# Patient Record
Sex: Male | Born: 1950 | Race: White | Hispanic: No | Marital: Married | Smoking: Former smoker
Health system: Southern US, Community
[De-identification: ages and names within clinical notes are randomized; demographics above are authoritative.]

## PROBLEM LIST (undated history)

## (undated) DIAGNOSIS — Z972 Presence of dental prosthetic device (complete) (partial): Secondary | ICD-10-CM

## (undated) DIAGNOSIS — K219 Gastro-esophageal reflux disease without esophagitis: Secondary | ICD-10-CM

## (undated) DIAGNOSIS — I10 Essential (primary) hypertension: Secondary | ICD-10-CM

## (undated) DIAGNOSIS — R7303 Prediabetes: Secondary | ICD-10-CM

## (undated) DIAGNOSIS — Z86010 Personal history of colonic polyps: Secondary | ICD-10-CM

## (undated) DIAGNOSIS — Z860101 Personal history of adenomatous and serrated colon polyps: Secondary | ICD-10-CM

## (undated) DIAGNOSIS — M1990 Unspecified osteoarthritis, unspecified site: Secondary | ICD-10-CM

## (undated) HISTORY — DX: Gastro-esophageal reflux disease without esophagitis: K21.9

## (undated) HISTORY — DX: Essential (primary) hypertension: I10

## (undated) HISTORY — DX: Personal history of colonic polyps: Z86.010

## (undated) HISTORY — DX: Prediabetes: R73.03

## (undated) HISTORY — PX: OTHER SURGICAL HISTORY: SHX169

## (undated) HISTORY — PX: COLONOSCOPY: SHX174

---

## 1968-04-27 HISTORY — PX: FINGER SURGERY: SHX640

## 2010-02-19 ENCOUNTER — Encounter (INDEPENDENT_AMBULATORY_CARE_PROVIDER_SITE_OTHER): Payer: Self-pay | Admitting: *Deleted

## 2010-03-26 ENCOUNTER — Encounter (INDEPENDENT_AMBULATORY_CARE_PROVIDER_SITE_OTHER): Payer: Self-pay | Admitting: *Deleted

## 2010-03-27 ENCOUNTER — Ambulatory Visit: Payer: Self-pay | Admitting: Internal Medicine

## 2010-04-03 ENCOUNTER — Ambulatory Visit: Payer: Self-pay | Admitting: Internal Medicine

## 2010-04-03 DIAGNOSIS — Z860100 Personal history of colon polyps, unspecified: Secondary | ICD-10-CM | POA: Insufficient documentation

## 2010-04-03 HISTORY — DX: Personal history of colonic polyps: Z86.010

## 2010-04-07 ENCOUNTER — Encounter: Payer: Self-pay | Admitting: Internal Medicine

## 2010-05-27 NOTE — Miscellaneous (Signed)
Summary: [facility]  Clinical Lists Changes  Medications: Added new medication of MOVIPREP 100 GM  SOLR (PEG-KCL-NACL-NASULF-NA ASC-C) As per prep instructions. - Signed Rx of MOVIPREP 100 GM  SOLR (PEG-KCL-NACL-NASULF-NA ASC-C) As per prep instructions.;  #1 x 0;  Signed;  Entered by: Ezra Sites RN;  Authorized by: Iva Boop MD, [facility];  Method used: Electronically to [phone]*, [address] [place], [place], [place]  [address], Ph: [phone] or [phone], Fax: [phone] Observations: Added new observation of NKA: T (03/27/2010 8:35)    Prescriptions: MOVIPREP 100 GM  SOLR (PEG-KCL-NACL-NASULF-NA ASC-C) As per prep instructions.  #1 x 0   Entered by:   Ezra Sites RN   Authorized by:   Iva Boop MD, [facility]   Signed by:   Ezra Sites RN on 03/27/2010   Method used:   Electronically to        [facility] [phone]* (retail)       [address] [place], [place]  [address]       Ph: [phone] or [phone]       Fax: [phone]   RxID:   [id number]

## 2010-05-27 NOTE — Letter (Signed)
Summary: [facility] Instructions  [facility]  [address] [place], [place] [address]   Phone: [phone]  Fax: [phone]       ISAAC DUBIE    1950/08/01    MRN: [id number]        Procedure Day /Date:  Thursday 04/03/2010     Arrival Time:  9:00 am     Procedure Time:  10:00 am     Location of Procedure:                    _ x_  [facility] (4th Floor)                        PREPARATION FOR COLONOSCOPY WITH MOVIPREP   Starting 5 days prior to your procedure Saturday 12/3 do not eat nuts, seeds, popcorn, corn, beans, peas,  salads, or any raw vegetables.  Do not take any fiber supplements (e.g. Metamucil, Citrucel, and Benefiber).  THE DAY BEFORE YOUR PROCEDURE         DATE: Wednesday 12/7  1.  Drink clear liquids the entire day-NO SOLID FOOD  2.  Do not drink anything colored red or purple.  Avoid juices with pulp.  No orange juice.  3.  Drink at least 64 oz. (8 glasses) of fluid/clear liquids during the day to prevent dehydration and help the prep work efficiently.  CLEAR LIQUIDS INCLUDE: Water Jello Ice Popsicles Tea (sugar ok, no milk/cream) Powdered fruit flavored drinks Coffee (sugar ok, no milk/cream) Gatorade Juice: apple, white grape, white cranberry  Lemonade Clear bullion, consomm, broth Carbonated beverages (any kind) Strained chicken noodle soup Hard Candy                             4.  In the morning, mix first dose of MoviPrep solution:    Empty 1 Pouch A and 1 Pouch B into the disposable container    Add lukewarm drinking water to the top line of the container. Mix to dissolve    Refrigerate (mixed solution should be used within 24 hrs)  5.  Begin drinking the prep at 5:00 p.m. The MoviPrep container is divided by 4 marks.   Every 15 minutes drink the solution down to the next mark (approximately 8 oz) until the full liter is complete.   6.  Follow completed prep with 16 oz of clear liquid of your choice  (Nothing red or purple).  Continue to drink clear liquids until bedtime.  7.  Before going to bed, mix second dose of MoviPrep solution:    Empty 1 Pouch A and 1 Pouch B into the disposable container    Add lukewarm drinking water to the top line of the container. Mix to dissolve    Refrigerate  THE DAY OF YOUR PROCEDURE      DATE: Thursday 12/8  Beginning at 5:00 a.m. (5 hours before procedure):         1. Every 15 minutes, drink the solution down to the next mark (approx 8 oz) until the full liter is complete.  2. Follow completed prep with 16 oz. of clear liquid of your choice.    3. You may drink clear liquids until 8:00 am (2 HOURS BEFORE PROCEDURE).   MEDICATION INSTRUCTIONS  Unless otherwise instructed, you should take regular prescription medications with a small sip of water   as early as possible the morning  of your procedure.           OTHER INSTRUCTIONS  You will need a responsible adult at least 62 years of age to accompany you and drive you home.   This person must remain in the waiting room during your procedure.  Wear loose fitting clothing that is easily removed.  Leave jewelry and other valuables at home.  However, you may wish to bring a book to read or  an iPod/MP3 player to listen to music as you wait for your procedure to start.  Remove all body piercing jewelry and leave at home.  Total time from sign-in until discharge is approximately 2-3 hours.  You should go home directly after your procedure and rest.  You can resume normal activities the  day after your procedure.  The day of your procedure you should not:   Drive   Make legal decisions   Operate machinery   Drink alcohol   Return to work  You will receive specific instructions about eating, activities and medications before you leave.    The above instructions have been reviewed and explained to me by   Ezra Sites RN  March 27, 2010 9:16 AM   I fully understand and  can verbalize these instructions _____________________________ Date _________

## 2010-05-27 NOTE — Letter (Signed)
Summary: Pre Visit Letter Revised  [facility]  [address] [place], [place] [address]   Phone: [phone]  Fax: [phone]        02/19/2010 MRN: [id number] Ronald Pitts [address] [place], [place]  [address]             Procedure Date:  04/03/2010   Welcome to the Gastroenterology Division at [facility].    You are scheduled to see a nurse for your pre-procedure visit on 03/27/2010 at 9:00AM on the 3rd floor at [facility], [address]  We ask that you try to arrive at our office 15 minutes prior to your appointment time to allow for check-in.  Please take a minute to review the attached form.  If you answer "Yes" to one or more of the questions on the first page, we ask that you call the person listed at your earliest opportunity.  If you answer "No" to all of the questions, please complete the rest of the form and bring it to your appointment.    Your nurse visit will consist of discussing your medical and surgical history, your immediate family medical history, and your medications.   If you are unable to list all of your medications on the form, please bring the medication bottles to your appointment and we will list them.  We will need to be aware of both prescribed and over the counter drugs.  We will need to know exact dosage information as well.    Please be prepared to read and sign documents such as consent forms, a financial agreement, and acknowledgement forms.  If necessary, and with your consent, a friend or relative is welcome to sit-in on the nurse visit with you.  Please bring your insurance card so that we may make a copy of it.  If your insurance requires a referral to see a specialist, please bring your referral form from your primary care physician.  No co-pay is required for this nurse visit.     If you cannot keep your appointment, please call [phone] to cancel or reschedule prior to your appointment date.  This allows [place] the  opportunity to schedule an appointment for another patient in need of care.    Thank you for choosing [facility] for your medical needs.  We appreciate the opportunity to care for you.  Please visit [place] at our website  to learn more about our practice.  Sincerely, The Gastroenterology Division

## 2010-05-29 NOTE — Letter (Signed)
Summary: Patient Notice- Polyp Results  [facility]  [address] [place], [place] [address]   Phone: [phone]  Fax: [phone]        April 07, 2010 MRN: [id number]    [facility] [address] [place], [place]  [address]    Dear Mr. Ronald Pitts,  Three of the four polyps removed from your colon were adenomatous. This means that they were pre-cancerous or that  they had the potential to change into cancer over time. The other polyp was hyperplastic and is not usually pre-cancerous.  I recommend that you have a repeat colonoscopy in 3 years to determine if you have developed any new polyps over time and screen for colorectal cancer. If you develop any new rectal bleeding, abdominal pain or significant bowel habit changes, please contact us before then.  In addition to repeating colonoscopy, changing health habits may reduce your risk of having more colon or rectal  polyps and possibly, colorectal cancer. You may lower your risk of future polyps and colorectal cancer by adopting healthy habits such as not smoking or using tobacco (if you do), being physically active, losing weight (if overweight), and eating a diet which includes fruits and vegetables and limits red meat.  Please call us if you are having persistent problems or have questions about your condition that have not been fully answered at this time.  Sincerely,  Iva Boop MD, [facility]  This letter has been electronically signed by your physician.  Appended Document: Patient Notice- Polyp Results Letter mailed

## 2010-05-29 NOTE — Procedures (Signed)
Summary: Colonoscopy  Patient: Ronald Pitts Note: All result statuses are Final unless otherwise noted.  Tests: (1) Colonoscopy (COL)   COL Colonoscopy           DONE     [facility]     [address]     [place], [place]  [address]           COLONOSCOPY PROCEDURE REPORT           PATIENT:  Ronald Pitts, Ronald Pitts  MR#:  [id number]     BIRTHDATE:  [birth year], 59 yrs. old  GENDER:  male     ENDOSCOPIST:  Iva Boop, MD, [facility]     REF. BY:  Joette Catching, M.D.     PROCEDURE DATE:  04/03/2010     PROCEDURE:  Colonoscopy with snare polypectomy     ASA CLASS:  Class II     INDICATIONS:  Routine Risk Screening     MEDICATIONS:   Fentanyl 50 mcg IV, Versed 5 mg IV           DESCRIPTION OF PROCEDURE:   After the risks benefits and     alternatives of the procedure were thoroughly explained, informed     consent was obtained.  Digital rectal exam was performed and     revealed no abnormalities and normal prostate.   The LB 180AL     [id number] endoscope was introduced through the anus and advanced to     the cecum, which was identified by both the appendix and ileocecal     valve, without limitations.  The quality of the prep was     excellent, using MoviPrep.  The instrument was then slowly     withdrawn as the colon was fully examined.     Insertion: 3:24 minutes Withdrawal; 18:47 minutes     <<PROCEDUREIMAGES>>           FINDINGS:  Four polyps were found. Cecum (1), transverse (2) and     rectum (1). All diminutive (5mm or less). Polyps were snared     without cautery. Retrieval was successful. Severe diverticulosis     was found in the left colon.  This was otherwise a normal     examination of the colon.   Retroflexed views in the rectum     revealed no abnormalities.    The scope was then withdrawn from     the patient and the procedure completed.           COMPLICATIONS:  None     ENDOSCOPIC IMPRESSION:     1) Four diminutive polyps removed.     2) Severe diverticulosis  in the left colon     3) Otherwise normal examination, excellent prep.           REPEAT EXAM:  In for Colonoscopy, pending biopsy results.           Iva Boop, MD, [facility]           CC:  Joette Catching, MD     The Patient           n.     eSIGNED:   Iva Boop at 04/03/2010 10:56 AM           Burt Knack, [id number]  Note: An exclamation mark (!) indicates a result that was not dispersed into the flowsheet. Document Creation Date: 04/03/2010 10:57 AM _______________________________________________________________________  (1) Order result status: Final Collection or observation date-time: 04/03/2010 10:48 Requested  date-time:  Receipt date-time:  Reported date-time:  Referring Physician:   Ordering Physician: Stan Head [id number]) Specimen Source:  Source: Launa Grill Order Number: [id number] Lab site:   Appended Document: Colonoscopy   Colonoscopy  Procedure date:  04/03/2010  Findings:          1) Four diminutive polyps removed.     2) Severe diverticulosis in the left colon     3) Otherwise normal examination, excellent prep.      - ADENOMATOUS POLYPS (FOUR FRAGMENTS) AND HYPERPLASTIC POLYP. NO HIGH GRADE DYSPLASIA OR INVASIVE MALIGNANCY IDENTIFIED.  Comments:      Repeat colonoscopy in 3 years.     Procedures Next Due Date:    Colonoscopy: 03/2013   Appended Document: Colonoscopy     Procedures Next Due Date:    Colonoscopy: 03/2013

## 2013-05-08 ENCOUNTER — Encounter: Payer: Self-pay | Admitting: Internal Medicine

## 2013-09-23 ENCOUNTER — Encounter: Payer: Self-pay | Admitting: Internal Medicine

## 2013-11-29 DIAGNOSIS — E782 Mixed hyperlipidemia: Secondary | ICD-10-CM | POA: Insufficient documentation

## 2013-11-29 DIAGNOSIS — I10 Essential (primary) hypertension: Secondary | ICD-10-CM | POA: Insufficient documentation

## 2013-11-29 DIAGNOSIS — E785 Hyperlipidemia, unspecified: Secondary | ICD-10-CM | POA: Insufficient documentation

## 2013-11-29 DIAGNOSIS — E1159 Type 2 diabetes mellitus with other circulatory complications: Secondary | ICD-10-CM | POA: Insufficient documentation

## 2015-03-11 ENCOUNTER — Encounter: Payer: Self-pay | Admitting: Internal Medicine

## 2015-04-24 ENCOUNTER — Ambulatory Visit (AMBULATORY_SURGERY_CENTER): Payer: 59

## 2015-04-24 VITALS — Ht 70.0 in | Wt 272.6 lb

## 2015-04-24 DIAGNOSIS — Z860100 Personal history of colon polyps, unspecified: Secondary | ICD-10-CM

## 2015-04-24 NOTE — Progress Notes (Signed)
No allergies to eggs or soy No home oxygen No past problems with anesthesia No diet/weight loss meds  Has email and internet; registered for emmi for colonoscopy

## 2015-05-08 ENCOUNTER — Encounter: Payer: Self-pay | Admitting: Internal Medicine

## 2015-05-08 ENCOUNTER — Ambulatory Visit (AMBULATORY_SURGERY_CENTER): Payer: Medicare Other | Admitting: Internal Medicine

## 2015-05-08 VITALS — BP 121/80 | HR 76 | Temp 98.1°F | Resp 42 | Ht 70.0 in | Wt 272.0 lb

## 2015-05-08 DIAGNOSIS — Z86010 Personal history of colonic polyps: Secondary | ICD-10-CM

## 2015-05-08 DIAGNOSIS — D123 Benign neoplasm of transverse colon: Secondary | ICD-10-CM | POA: Diagnosis not present

## 2015-05-08 DIAGNOSIS — D120 Benign neoplasm of cecum: Secondary | ICD-10-CM | POA: Diagnosis not present

## 2015-05-08 HISTORY — PX: COLONOSCOPY W/ POLYPECTOMY: SHX1380

## 2015-05-08 MED ORDER — SODIUM CHLORIDE 0.9 % IV SOLN: 500.0000 mL | INTRAVENOUS | Status: DC

## 2015-05-08 NOTE — Progress Notes (Signed)
Called to room to assist during endoscopic procedure.  Patient ID and intended procedure confirmed with present staff. Received instructions for my participation in the procedure from the performing physician.  

## 2015-05-08 NOTE — Op Note (Signed)
[facility]  [address] [place], [address]   COLONOSCOPY PROCEDURE REPORT  PATIENT: Ronald Pitts, Ronald Pitts  MR#: [id number] BIRTHDATE: [birth year] , 37  yrs. old GENDER: male ENDOSCOPIST: Gatha Mayer, MD, [facility] PROCEDURE DATE:  05/08/2015 PROCEDURE:   Colonoscopy with snare polypectomy First Screening Colonoscopy - Avg.  risk and is 50 yrs.  old or older - No.  Prior Negative Screening - Now for repeat screening. N/A  History of Adenoma - Now for follow-up colonoscopy & has been > or = to 3 yrs.  Yes hx of adenoma.  Has been 3 or more years since last colonoscopy.  Polyps removed today? Yes ASA CLASS:   Class II INDICATIONS:Surveillance due to prior colonic neoplasia and PH Colon Adenoma. MEDICATIONS: Propofol 240 mg IV and Monitored anesthesia care  DESCRIPTION OF PROCEDURE:   After the risks benefits and alternatives of the procedure were thoroughly explained, informed consent was obtained.  The digital rectal exam revealed no abnormalities of the rectum, revealed the prostate was not enlarged, and revealed no prostatic nodules.   The LB [id number]  endoscope was introduced through the anus and advanced to the cecum, which was identified by both the appendix and ileocecal valve. No adverse events experienced.   The quality of the prep was good.  (MiraLax was used)  The instrument was then slowly withdrawn as the colon was fully examined. Estimated blood loss is zero unless otherwise noted in this procedure report.   COLON FINDINGS: Two sessile polyps measuring 5 mm in size were found in the transverse colon and at the cecum.  Polypectomies were performed with a cold snare.  The resection was complete, the polyp tissue was completely retrieved and sent to histology.   There was diverticulosis noted in the sigmoid colon.   The examination was otherwise normal.  Retroflexed views revealed no abnormalities. The time to cecum = 2.0 Withdrawal time = 15.7   The  scope was withdrawn and the procedure completed. COMPLICATIONS: There were no immediate complications.  ENDOSCOPIC IMPRESSION: 1.   Two sessile polyps were found in the transverse colon and at the cecum; polypectomies were performed with a cold snare 2.   Diverticulosis was noted in the sigmoid colon 3.   The examination was otherwise normal  RECOMMENDATIONS: Timing of repeat colonoscopy will be determined by pathology findings.  Likely 5 yrs - he also has 3 diminutive adenomas 2011  eSigned:  Gatha Mayer, MD, [facility] 05/08/2015 11:38 AM   cc: Dr. Dione Housekeeper and The Patient

## 2015-05-08 NOTE — Progress Notes (Signed)
Report to PACU, RN, vss, BBS= Clear.  

## 2015-05-08 NOTE — Patient Instructions (Addendum)
I found and removed 2 small polyps that look benign.  You also have a condition called diverticulosis - common and not usually a problem. Please read the handout provided.  I will let you know pathology results and when to have another routine colonoscopy by mail.  I appreciate the opportunity to care for you. Gatha Mayer, MD, FACG   YOU HAD AN ENDOSCOPIC PROCEDURE TODAY AT [facility]:   Refer to the procedure report that was given to you for any specific questions about what was found during the examination.  If the procedure report does not answer your questions, please call your gastroenterologist to clarify.  If you requested that your care partner not be given the details of your procedure findings, then the procedure report has been included in a sealed envelope for you to review at your convenience later.  YOU SHOULD EXPECT: Some feelings of bloating in the abdomen. Passage of more gas than usual.  Walking can help get rid of the air that was put into your GI tract during the procedure and reduce the bloating. If you had a lower endoscopy (such as a colonoscopy or flexible sigmoidoscopy) you may notice spotting of blood in your stool or on the toilet paper. If you underwent a bowel prep for your procedure, you may not have a normal bowel movement for a few days.  Please Note:  You might notice some irritation and congestion in your nose or some drainage.  This is from the oxygen used during your procedure.  There is no need for concern and it should clear up in a day or so.  SYMPTOMS TO REPORT IMMEDIATELY:   Following lower endoscopy (colonoscopy or flexible sigmoidoscopy):  Excessive amounts of blood in the stool  Significant tenderness or worsening of abdominal pains  Swelling of the abdomen that is new, acute  Fever of 100F or higher   For urgent or emergent issues, a gastroenterologist can be reached at any hour by calling [phone]  [phone].   DIET: Your first meal following the procedure should be a small meal and then it is ok to progress to your normal diet. Heavy or fried foods are harder to digest and may make you feel nauseous or bloated.  Likewise, meals heavy in dairy and vegetables can increase bloating.  Drink plenty of fluids but you should avoid alcoholic beverages for 24 hours.  ACTIVITY:  You should plan to take it easy for the rest of today and you should NOT DRIVE or use heavy machinery until tomorrow (because of the sedation medicines used during the test).    FOLLOW UP: Our staff will call the number listed on your records the next business day following your procedure to check on you and address any questions or concerns that you may have regarding the information given to you following your procedure. If we do not reach you, we will leave a message.  However, if you are feeling well and you are not experiencing any problems, there is no need to return our call.  We will assume that you have returned to your regular daily activities without incident.  If any biopsies were taken you will be contacted by phone or by letter within the next 1-3 weeks.  Please call us at [phone] if you have not heard about the biopsies in 3 weeks.    SIGNATURES/CONFIDENTIALITY: You and/or your care partner have signed paperwork which will be entered into your electronic medical record.  These signatures attest to the fact that that the information above on your After Visit Summary has been reviewed and is understood.  Full responsibility of the confidentiality of this discharge information lies with you and/or your care-partner.   Information on polyps and diverticulosis given to you today

## 2015-05-09 ENCOUNTER — Telehealth: Payer: Self-pay

## 2015-05-09 NOTE — Telephone Encounter (Signed)
  Follow up Call-  Call back number 05/08/2015  Post procedure Call Back phone  # [phone]  Permission to leave phone message Yes     Patient questions:  Do you have a fever, pain , or abdominal swelling? No. Pain Score  0 *  Have you tolerated food without any problems? Yes.    Have you been able to return to your normal activities? Yes.    Do you have any questions about your discharge instructions: Diet   No. Medications  No. Follow up visit  No.  Do you have questions or concerns about your Care? No.  Actions: * If pain score is 4 or above: No action needed, pain <4.

## 2015-05-13 ENCOUNTER — Encounter: Payer: Self-pay | Admitting: Internal Medicine

## 2015-05-13 DIAGNOSIS — Z86010 Personal history of colonic polyps: Secondary | ICD-10-CM

## 2015-05-13 NOTE — Progress Notes (Signed)
Quick Note:  2 adenomas - recall colon 2022 ______

## 2015-11-02 ENCOUNTER — Emergency Department (HOSPITAL_COMMUNITY): Payer: Medicare Other

## 2015-11-02 ENCOUNTER — Encounter (HOSPITAL_COMMUNITY): Payer: Self-pay | Admitting: *Deleted

## 2015-11-02 ENCOUNTER — Emergency Department (HOSPITAL_COMMUNITY)
Admission: EM | Admit: 2015-11-02 | Discharge: 2015-11-02 | Disposition: A | Discharge status: Home / Self Care | Payer: Medicare Other | Attending: Emergency Medicine | Admitting: Emergency Medicine

## 2015-11-02 DIAGNOSIS — Z87891 Personal history of nicotine dependence: Secondary | ICD-10-CM | POA: Diagnosis not present

## 2015-11-02 DIAGNOSIS — I10 Essential (primary) hypertension: Secondary | ICD-10-CM | POA: Diagnosis not present

## 2015-11-02 DIAGNOSIS — Z7982 Long term (current) use of aspirin: Secondary | ICD-10-CM | POA: Insufficient documentation

## 2015-11-02 DIAGNOSIS — M25562 Pain in left knee: Secondary | ICD-10-CM | POA: Insufficient documentation

## 2015-11-02 MED ORDER — IBUPROFEN 800 MG PO TABS: 800.0000 mg | ORAL_TABLET | Freq: Three times a day (TID) | ORAL | Status: DC

## 2015-11-02 MED ORDER — HYDROCODONE-ACETAMINOPHEN 5-325 MG PO TABS: ORAL_TABLET | ORAL | Status: DC

## 2015-11-02 MED ORDER — HYDROCODONE-ACETAMINOPHEN 5-325 MG PO TABS
1.0000 | ORAL_TABLET | Freq: Once | ORAL | Status: AC
  Administered 2015-11-02: 1 via ORAL
  Filled 2015-11-02: qty 1

## 2015-11-02 NOTE — Discharge Instructions (Signed)
Cryotherapy Cryotherapy is when you put ice on your injury. Ice helps lessen pain and puffiness (swelling) after an injury. Ice works the best when you start using it in the first 24 to 48 hours after an injury. HOME CARE  Put a dry or damp towel between the ice pack and your skin.  You may press gently on the ice pack.  Leave the ice on for no more than 10 to 20 minutes at a time.  Check your skin after 5 minutes to make sure your skin is okay.  Rest at least 20 minutes between ice pack uses.  Stop using ice when your skin loses feeling (numbness).  Do not use ice on someone who cannot tell you when it hurts. This includes small children and people with memory problems (dementia). GET HELP RIGHT AWAY IF:  You have white spots on your skin.  Your skin turns blue or pale.  Your skin feels waxy or hard.  Your puffiness gets worse. MAKE SURE YOU:   Understand these instructions.  Will watch your condition.  Will get help right away if you are not doing well or get worse.   This information is not intended to replace advice given to you by your health care provider. Make sure you discuss any questions you have with your health care provider.   Document Released: 09/30/2007 Document Revised: 07/06/2011 Document Reviewed: 12/04/2010 Elsevier Interactive Patient Education 2016 Elsevier Inc.  Knee Pain Knee pain is a common problem. It can have many causes. The pain often goes away by following your doctor's home care instructions. Treatment for ongoing pain will depend on the cause of your pain. If your knee pain continues, more tests may be needed to diagnose your condition. Tests may include X-rays or other imaging studies of your knee. HOME CARE  Take medicines only as told by your doctor.  Rest your knee and keep it raised (elevated) while you are resting.  Do not do things that cause pain or make your pain worse.  Avoid activities where both feet leave the ground at  the same time, such as running, jumping rope, or doing jumping jacks.  Apply ice to the knee area:  Put ice in a plastic bag.  Place a towel between your skin and the bag.  Leave the ice on for 20 minutes, 2-3 times a day.  Ask your doctor if you should wear an elastic knee support.  Sleep with a pillow under your knee.  Lose weight if you are overweight. Being overweight can make your knee hurt more.  Do not use any tobacco products, including cigarettes, chewing tobacco, or electronic cigarettes. If you need help quitting, ask your doctor. Smoking may slow the healing of any bone and joint problems that you may have. GET HELP IF:  Your knee pain does not stop, it changes, or it gets worse.  You have a fever along with knee pain.  Your knee gives out or locks up.  Your knee becomes more swollen. GET HELP RIGHT AWAY IF:   Your knee feels hot to the touch.  You have chest pain or trouble breathing.   This information is not intended to replace advice given to you by your health care provider. Make sure you discuss any questions you have with your health care provider.   Document Released: 07/10/2008 Document Revised: 05/04/2014 Document Reviewed: 06/14/2013 Elsevier Interactive Patient Education [employer].

## 2015-11-02 NOTE — ED Notes (Signed)
Pt comes in with left knee pain starting 4-5 days ago. Pt denies any injury but states that he was standing for several hours the day that it began hurting.

## 2015-11-05 NOTE — ED Provider Notes (Signed)
CSN: [id number]     Arrival date & time 11/02/15  [id number] History   First MD Initiated Contact with Patient 11/02/15 [phone]     Chief Complaint  Patient presents with  . Knee Pain     (Consider location/radiation/quality/duration/timing/severity/associated sxs/prior Treatment) HPI  Ronald Pitts is a 65 y.o. male who presents to the Emergency Department complaining of left knee pain for 5 days.  He states the pain began after excessive standing.  He complains of pain with bending and states the knee "feels stiff."  He has not taken any medications for symptoms relief.  He denies fall, redness, swelling, numbness or weakness of the extremity.   Past Medical History  Diagnosis Date  . Hypertension   . Personal history of colonic polyps 04/03/2010   Past Surgical History  Procedure Laterality Date  . Index finger      right  . Colonoscopy     Family History  Problem Relation Age of Onset  . Colon cancer Neg Hx    Social History  Substance Use Topics  . Smoking status: Former Research scientist (life sciences)  . Smokeless tobacco: Never Used  . Alcohol Use: 0.0 oz/week    0 Standard drinks or equivalent per week     Comment: OCC    Review of Systems  Constitutional: Negative for fever and chills.  Musculoskeletal: Positive for arthralgias (left knee pain). Negative for joint swelling.  Skin: Negative for color change and wound.  Neurological: Negative for numbness.  All other systems reviewed and are negative.     Allergies  Review of patient's allergies indicates no known allergies.  Home Medications   Prior to Admission medications   Medication Sig Start Date End Date Taking? Authorizing Provider  amLODipine (NORVASC) 5 MG tablet Take 5 mg by mouth daily.    Historical Provider, MD  aspirin 81 MG tablet Take by mouth daily.    Historical Provider, MD  Cholecalciferol (VITAMIN D3) 1000 units CAPS Take 1,000 Units by mouth.    Historical Provider, MD  HYDROcodone-acetaminophen (NORCO/VICODIN)  5-325 MG tablet Take one tab po q 4-6 hrs prn pain 11/02/15   Cendy Oconnor, PA-C  ibuprofen (ADVIL,MOTRIN) 800 MG tablet Take 1 tablet (800 mg total) by mouth 3 (three) times daily. 11/02/15   Teniyah Seivert, PA-C  meloxicam (MOBIC) 15 MG tablet Take 15 mg by mouth daily.    Historical Provider, MD  Omega-3 Fatty Acids (FISH OIL) 1000 MG CAPS Take by mouth.    Historical Provider, MD  Vitamins/Minerals TABS Take by mouth.    Historical Provider, MD   BP 136/88 mmHg  Pulse 78  Temp(Src) 98 F (36.7 C) (Oral)  Resp 18  Ht 5\' 10"  (1.778 m)  Wt 117.935 kg  BMI 37.31 kg/m2  SpO2 93% Physical Exam  Constitutional: He is oriented to person, place, and time. He appears well-developed and well-nourished. No distress.  Cardiovascular: Normal rate, regular rhythm and intact distal pulses.   Pulmonary/Chest: Effort normal and breath sounds normal.  Musculoskeletal: Normal range of motion. He exhibits tenderness. He exhibits no edema.  ttp of the anterior left knee.  No erythema, effusion, or step-off deformity.  DP pulse brisk, distal sensation intact. Calf is soft and NT.  Neurological: He is alert and oriented to person, place, and time. He exhibits normal muscle tone. Coordination normal.  Skin: Skin is warm and dry. No erythema.  Nursing note and vitals reviewed.   ED Course  Procedures (including critical care time) Labs Review Labs  Reviewed - No data to display  Imaging Review Dg Knee Complete 4 Views Left  11/02/2015  CLINICAL DATA:  Sudden onset of LEFT knee pain yesterday, unable to bear weight, initial encounter EXAM: LEFT KNEE - COMPLETE 4+ VIEW COMPARISON:  None FINDINGS: Osseous mineralization normal. Joint spaces preserved. Patellar spur at quadriceps tendon insertion. No acute fracture, dislocation or bone destruction. Knee joint effusion present. IMPRESSION: Knee joint effusion and mild patellar spurring without acute bony abnormalities. Electronically Signed   By: Lavonia Dana M.D.    On: 11/02/2015 09:09    I have personally reviewed and evaluated these images and lab results as part of my medical decision-making.   EKG Interpretation None      MDM   Final diagnoses:  Left knee pain    Pt well appearing.  No concerning sx's for septic joint.  NV intact.  Agrees to symptomatic tx and ortho f/u   Kem Parkinson, PA-C 11/05/15 [place], MD 11/06/15 1755

## 2015-11-14 ENCOUNTER — Ambulatory Visit (INDEPENDENT_AMBULATORY_CARE_PROVIDER_SITE_OTHER): Payer: Medicare Other | Admitting: Orthopedic Surgery

## 2015-11-14 VITALS — BP 129/86 | HR 95 | Ht 70.0 in | Wt 273.2 lb

## 2015-11-14 DIAGNOSIS — S83242A Other tear of medial meniscus, current injury, left knee, initial encounter: Secondary | ICD-10-CM | POA: Diagnosis not present

## 2015-11-14 NOTE — Progress Notes (Signed)
Patient ID: Ronald Pitts, male   DOB: May 11, 1950, 65 y.o.   MRN: [id number]  Chief Complaint  Patient presents with  . New Patient (Initial Visit)    ER Follow up for Left knee pain    HPI Ronald Pitts is a 65 y.o. male.  Presents for evaluation of medial knee pain 3 weeks  He was standing watching some people work on in addition to his home to that for 2 days started having some medial knee pain then went to his sons to help with some speaks together squatted down and felt intense pain medial aspect of left knee and since that time he's been having increasing pain swelling loss of motion dull sharp throbbing occasionally stabbing pain with catching  Initial treatment included rest, Advil, Aleve, Vicodin  Review of Systems Review of Systems  Constitutional: Negative for fever and chills.  Respiratory: Negative for chest tightness and shortness of breath.   Cardiovascular: Negative for chest pain.    Past Medical History  Diagnosis Date  . Hypertension   . Personal history of colonic polyps 04/03/2010    Past Surgical History  Procedure Laterality Date  . Index finger      right  . Colonoscopy      Family History  Problem Relation Age of Onset  . Colon cancer Neg Hx    was reviewed  Social History Social History  Substance Use Topics  . Smoking status: Former Research scientist (life sciences)  . Smokeless tobacco: Never Used  . Alcohol Use: 0.0 oz/week    0 Standard drinks or equivalent per week     Comment: OCC    No Known Allergies  Current Outpatient Prescriptions  Medication Sig Dispense Refill  . amLODipine (NORVASC) 5 MG tablet Take 5 mg by mouth daily.    Marland Kitchen aspirin 81 MG tablet Take by mouth daily.    . Cholecalciferol (VITAMIN D3) 1000 units CAPS Take 1,000 Units by mouth.    Marland Kitchen HYDROcodone-acetaminophen (NORCO/VICODIN) 5-325 MG tablet Take one tab po q 4-6 hrs prn pain 10 tablet 0  . ibuprofen (ADVIL,MOTRIN) 800 MG tablet Take 1 tablet (800 mg total) by mouth 3 (three) times  daily. 21 tablet 0  . meloxicam (MOBIC) 15 MG tablet Take 15 mg by mouth daily.    . Omega-3 Fatty Acids (FISH OIL) 1000 MG CAPS Take by mouth.    . Vitamins/Minerals TABS Take by mouth.     No current facility-administered medications for this visit.       Physical Exam Physical Exam  Constitutional: He is oriented to person, place, and time. He appears well-developed and well-nourished. No distress.  Cardiovascular: Normal rate and intact distal pulses.   Neurological: He is alert and oriented to person, place, and time.  Skin: Skin is warm and dry. No rash noted. He is not diaphoretic. No erythema. No pallor.  Psychiatric: He has a normal mood and affect. His behavior is normal. Judgment and thought content normal.    Ortho Exam  Slight limp favoring the left leg. Right knee no swelling no tenderness full range of motion knee strength normal stability tests normal skin intact no peripheral edema normal sensation  Left knee medial joint line tenderness positive McMurray sign loss of extension loss of flexion 10 stability tests normal strength normal skin normal no edema   Neurologic examination  Reflexes were 2+ and equal  Sensation was normal in both feet and legs  Babinski's tests were down going  Straight leg raise testing was  normal bilaterally  The vascular examination revealed normal dorsalis pedis pulses in both feet and both feet were warm with good capillary refill   Data Reviewed Plain film report negative I saw the x-ray and agree that it shows mild arthritis  Assessment  Torn medial meniscus   Plan  MRI left knee  Continue ice and Advil   Arther Abbott, MD 11/14/2015 2:36 PM

## 2015-11-14 NOTE — Patient Instructions (Signed)
Meniscus Tear A meniscus tear is a knee injury in which a piece of the meniscus is torn. The meniscus is a thick, rubbery, wedge-shaped cartilage in the knee. Two menisci are located in each knee. They sit between the upper bone (femur) and lower bone (tibia) that make up the knee joint. Each meniscus acts as a shock absorber for the knee. A torn meniscus is one of the most common types of knee injuries. This injury can range from mild to severe. Surgery may be needed for a severe tear. CAUSES This injury may be caused by any squatting, twisting, or pivoting movement. Sports-related injuries are the most common cause. These often occur from:  Running and stopping suddenly.  Changing direction.  Being tackled or knocked off your feet. As people get older, their meniscus gets thinner and weaker. In these people, tears can happen more easily, such as from climbing stairs.  RISK FACTORS This injury is more likely to happen to:  People who play contact sports.  Males.  People who are 56-62 years of age. SYMPTOMS  Symptoms of this injury include:  Knee pain, especially at the side of the knee joint. You may feel pain when the injury occurs, or you may only hear a pop and feel pain later.  A feeling that your knee is clicking, catching, locking, or giving way.  Not being able to fully bend or extend your knee.  Bruising or swelling in your knee. DIAGNOSIS  This injury may be diagnosed based on your symptoms and a physical exam. The physical exam may include:  Moving your knee in different ways.  Feeling for tenderness.  Listening for a clicking sound.  Checking if your knee locks or catches. You may also have tests, such as:  X-rays.  MRI.  A procedure to look inside your knee with a narrow surgical telescope (arthroscopy). You may be referred to a knee specialist (orthopedic surgeon). TREATMENT  Treatment for this injury depends on the severity of the tear. Treatment for a  mild tear may include:  Rest.  Medicine to reduce pain and swelling. This is usually a nonsteroidal anti-inflammatory drug (NSAID).  A knee brace or an elastic sleeve or wrap.  Using crutches or a walker to keep weight off your knee and to help you walk.  Exercises to strengthen your knee (physical therapy). You may need surgery if you have a severe tear or if other treatments are not working.  HOME CARE INSTRUCTIONS Managing Pain and Swelling  Take over-the-counter and prescription medicines only as told by your health care provider.  If directed, apply ice to the injured area:  Put ice in a plastic bag.  Place a towel between your skin and the bag.  Leave the ice on for 20 minutes, 2-3 times per day.  Raise (elevate) the injured area above the level of your heart while you are sitting or lying down. Activity  Do not use the injured limb to support your body weight until your health care provider says that you can. Use crutches or a walker as told by your health care provider.  Return to your normal activities as told by your health care provider. Ask your health care provider what activities are safe for you.  Perform range-of-motion exercises only as told by your health care provider.  Begin doing exercises to strengthen your knee and leg muscles only as told by your health care provider. After you recover, your health care provider may recommend these exercises to  help prevent another injury. General Instructions  Use a knee brace or elastic wrap as told by your health care provider.  Keep all follow-up visits as told by your health care provider. This is important. SEEK MEDICAL CARE IF:  You have a fever.  Your knee becomes red, tender, or swollen.  Your pain medicine is not helping.  Your symptoms get worse or do not improve after 2 weeks of home care.   This information is not intended to replace advice given to you by your health care provider. Make sure you  discuss any questions you have with your health care provider.   Document Released: 07/04/2002 Document Revised: 01/02/2015 Document Reviewed: 08/06/2014 Elsevier Interactive Patient Education 2016 [employer] Knee Arthroscopy Knee arthroscopy is a surgical procedure that is used to examine the inside of your knee joint and repair any damage. The surgeon puts a small, lighted instrument with a camera on the tip (arthroscope) through a small incision in your knee. The camera sends pictures to a monitor in the operating room. Your surgeon uses those pictures to guide the surgical instruments through other incisions to the area of damage. Knee arthroscopy can be used to treat many types of knee problems. It may be used:  To repair a torn ligament.  To repair or remove damaged tissue.  To remove a fluid-filled sac (cyst) from your knee. LET [facility] CARE PROVIDER KNOW ABOUT:  Any allergies you have.  All medicines you are taking, including vitamins, herbs, eye drops, creams, and over-the-counter medicines.  Previous problems you or members of your family have had with the use of anesthetics.  Any blood disorders you have.  Previous surgeries you have had.  Any medical conditions you may have. RISKS AND COMPLICATIONS Generally, this is a safe procedure. However, problems may occur, including:  Infection.  Bleeding.  Damage to blood vessels, nerves, or structures of your knee.  A blood clot that forms in your leg and travels to your lung.  Failure to relieve symptoms. BEFORE THE PROCEDURE  Ask your health care provider about:  Changing or stopping your regular medicines. This is especially important if you are taking diabetes medicines or blood thinners.  Taking medicines such as aspirin and ibuprofen. These medicines can thin your blood. Do not take these medicines before your procedure if your health care provider instructs you not to.  Follow your health care  provider's instructions about eating or drinking restrictions.  Plan to have someone take you home after the procedure.  If you go home right after the procedure, plan to have someone with you for 24 hours.  Do not drink alcohol unless your health care provider says that you can.  Do not use any tobacco products, including cigarettes, chewing tobacco, or electronic cigarettes unless your health care provider says that you can. If you need help quitting, ask your health care provider.  You may have a physical exam. PROCEDURE  An IV tube will be inserted into one of your veins.  You will be given one or more of the following:  A medicine that helps you relax (sedative).  A medicine that numbs the area (local anesthetic).  A medicine that makes you fall asleep (general anesthetic).  A medicine that is injected into your spine that numbs the area below and slightly above the injection site (spinal anesthetic).  A medicine that is injected into an area of your body that numbs everything below the injection site (regional anesthetic).  A  cuff may be placed around your upper leg to slow bleeding during the procedure.  The surgeon will make a small number of incisions around your knee.  Your knee joint will be flushed and filled with a germ-free (sterile) solution.  The arthroscope will be passed through an incision into your knee joint.  More instruments will be passed through other incisions to repair your knee as needed.  The fluid will be removed from your knee.  The incisions will be closed with adhesive strips or stitches (sutures).  A bandage (dressing) will be placed over your knee. The procedure may vary among health care providers and hospitals. AFTER THE PROCEDURE  Your blood pressure, heart rate, breathing rate and blood oxygen level will be monitored often until the medicines you were given have worn off.  You may be given medicine for pain.  You may get crutches  to help you walk without using your knee to support your body weight.  You may have to wear compression stockings. These stocking help to prevent blood clots and reduce swelling in your legs.   This information is not intended to replace advice given to you by your health care provider. Make sure you discuss any questions you have with your health care provider.   Document Released: 04/10/2000 Document Revised: 08/28/2014 Document Reviewed: 04/09/2014 Elsevier Interactive Patient Education [employer].

## 2015-12-03 ENCOUNTER — Ambulatory Visit (HOSPITAL_COMMUNITY)
Admission: RE | Admit: 2015-12-03 | Discharge: 2015-12-03 | Disposition: A | Discharge status: Home / Self Care | Payer: Medicare Other | Source: Ambulatory Visit | Attending: Orthopedic Surgery | Admitting: Orthopedic Surgery

## 2015-12-03 DIAGNOSIS — S83412A Sprain of medial collateral ligament of left knee, initial encounter: Secondary | ICD-10-CM | POA: Diagnosis not present

## 2015-12-03 DIAGNOSIS — M25462 Effusion, left knee: Secondary | ICD-10-CM | POA: Diagnosis not present

## 2015-12-03 DIAGNOSIS — S83242A Other tear of medial meniscus, current injury, left knee, initial encounter: Secondary | ICD-10-CM | POA: Diagnosis present

## 2015-12-03 DIAGNOSIS — X58XXXA Exposure to other specified factors, initial encounter: Secondary | ICD-10-CM | POA: Insufficient documentation

## 2015-12-03 DIAGNOSIS — M71562 Other bursitis, not elsewhere classified, left knee: Secondary | ICD-10-CM | POA: Diagnosis not present

## 2015-12-05 ENCOUNTER — Encounter: Payer: Self-pay | Admitting: Orthopedic Surgery

## 2015-12-05 ENCOUNTER — Ambulatory Visit (INDEPENDENT_AMBULATORY_CARE_PROVIDER_SITE_OTHER): Payer: Medicare Other | Admitting: Orthopedic Surgery

## 2015-12-05 VITALS — BP 133/88 | HR 89 | Ht 69.0 in | Wt 272.0 lb

## 2015-12-05 DIAGNOSIS — M129 Arthropathy, unspecified: Secondary | ICD-10-CM

## 2015-12-05 DIAGNOSIS — S83242D Other tear of medial meniscus, current injury, left knee, subsequent encounter: Secondary | ICD-10-CM | POA: Diagnosis not present

## 2015-12-05 DIAGNOSIS — M1712 Unilateral primary osteoarthritis, left knee: Secondary | ICD-10-CM

## 2015-12-05 DIAGNOSIS — M8430XA Stress fracture, unspecified site, initial encounter for fracture: Secondary | ICD-10-CM

## 2015-12-05 MED ORDER — TRAMADOL-ACETAMINOPHEN 37.5-325 MG PO TABS: 1.0000 | ORAL_TABLET | Freq: Four times a day (QID) | ORAL | 2 refills | Status: DC | PRN

## 2015-12-05 NOTE — Patient Instructions (Signed)
Wear brace for 6 weeks  Continue ibuprofen as needed start Ultracet for severe pain

## 2015-12-05 NOTE — Progress Notes (Signed)
Chief Complaint  Patient presents with  . Results    MRI left knee    Recheck after MRI left knee patient was having medial left knee pain which increased after he squatted down. His initial treatment included rest Advil Aleve and Vicodin without relief. He went for MRI MRI shows complex tear medial meniscus with stress reaction medial tibial bone and osteoarthritis medial compartment  Review of systems  He denies any mechanical symptoms  Past Medical History:  Diagnosis Date  . Hypertension   . Personal history of colonic polyps 04/03/2010    BP 133/88   Pulse 89   Ht 5\' 9"  (1.753 m)   Wt 272 lb (123.4 kg)   BMI 40.17 kg/m   Physical Exam  Constitutional: He is oriented to person, place, and time. He appears well-developed and well-nourished. No distress.  Musculoskeletal:  Tenderness over the medial joint line. No effusion. Knee flexion arc is 120. Knee is stable. No peripheral edema is noted.  Neurological: He is alert and oriented to person, place, and time.  Skin: Skin is warm and dry. He is not diaphoretic.   MRI  I interpret the MRI is having a torn medial meniscus osteoarthritis and stress fracture the proximal tibia  This is confirmed by MRI report  Recommend hinged knee brace  We talked about living with torn meniscus if he has any mechanical symptoms he should call the office and will probably need surgery  We placed him on Ultracet to take for pain if ibuprofen or Aleve is not working.  Follow-up 6 weeks clinical exam

## 2016-01-02 ENCOUNTER — Emergency Department (HOSPITAL_COMMUNITY)
Admission: EM | Admit: 2016-01-02 | Discharge: 2016-01-02 | Disposition: A | Discharge status: Home / Self Care | Payer: Medicare Other | Attending: Emergency Medicine | Admitting: Emergency Medicine

## 2016-01-02 ENCOUNTER — Emergency Department (HOSPITAL_COMMUNITY): Payer: Medicare Other

## 2016-01-02 ENCOUNTER — Encounter (HOSPITAL_COMMUNITY): Payer: Self-pay

## 2016-01-02 DIAGNOSIS — Y9389 Activity, other specified: Secondary | ICD-10-CM | POA: Insufficient documentation

## 2016-01-02 DIAGNOSIS — Y929 Unspecified place or not applicable: Secondary | ICD-10-CM | POA: Insufficient documentation

## 2016-01-02 DIAGNOSIS — I10 Essential (primary) hypertension: Secondary | ICD-10-CM | POA: Insufficient documentation

## 2016-01-02 DIAGNOSIS — W268XXA Contact with other sharp object(s), not elsewhere classified, initial encounter: Secondary | ICD-10-CM | POA: Diagnosis not present

## 2016-01-02 DIAGNOSIS — S62661B Nondisplaced fracture of distal phalanx of left index finger, initial encounter for open fracture: Secondary | ICD-10-CM | POA: Diagnosis not present

## 2016-01-02 DIAGNOSIS — S62609B Fracture of unspecified phalanx of unspecified finger, initial encounter for open fracture: Secondary | ICD-10-CM

## 2016-01-02 DIAGNOSIS — S61219A Laceration without foreign body of unspecified finger without damage to nail, initial encounter: Secondary | ICD-10-CM

## 2016-01-02 DIAGNOSIS — Z7982 Long term (current) use of aspirin: Secondary | ICD-10-CM | POA: Insufficient documentation

## 2016-01-02 DIAGNOSIS — Z87891 Personal history of nicotine dependence: Secondary | ICD-10-CM | POA: Diagnosis not present

## 2016-01-02 DIAGNOSIS — Y999 Unspecified external cause status: Secondary | ICD-10-CM | POA: Insufficient documentation

## 2016-01-02 DIAGNOSIS — S6992XA Unspecified injury of left wrist, hand and finger(s), initial encounter: Secondary | ICD-10-CM | POA: Diagnosis present

## 2016-01-02 MED ORDER — CEPHALEXIN 500 MG PO CAPS: 500.0000 mg | ORAL_CAPSULE | Freq: Four times a day (QID) | ORAL | 0 refills | Status: DC

## 2016-01-02 MED ORDER — HYDROCODONE-ACETAMINOPHEN 5-325 MG PO TABS: 1.0000 | ORAL_TABLET | ORAL | 0 refills | Status: DC | PRN

## 2016-01-02 MED ORDER — BUPIVACAINE HCL (PF) 0.5 % IJ SOLN
10.0000 mL | Freq: Once | INTRAMUSCULAR | Status: DC
  Filled 2016-01-02: qty 30

## 2016-01-02 MED ORDER — CEPHALEXIN 500 MG PO CAPS
500.0000 mg | ORAL_CAPSULE | Freq: Once | ORAL | Status: AC
  Administered 2016-01-02: 500 mg via ORAL
  Filled 2016-01-02: qty 1

## 2016-01-02 NOTE — ED Triage Notes (Signed)
Patient reports of working with wood on a saw and wood cut left index finger. Dressing applied. Bleeding controlled.

## 2016-01-02 NOTE — ED Provider Notes (Signed)
[facility] DEPT Provider Note   CSN: [id number] Arrival date & time: 01/02/16  1705     History   Chief Complaint Chief Complaint  Patient presents with  . Laceration    HPI Ronald Pitts is a 65 y.o. male who presents from his physicians office for further evaluation of a finger injury occurring several hours before arrival.  He was cutting a piece of wood using a saw when the wood bounced into the tip of his finger causing laceration.  He has been able to apply pressure to stop the bleeding.  His pcp felt he needed xrays, hence he was referred here.  He is utd with his tetanus.  He describes constant throbbing pain but is able to flex and extend the finger without pain.     Laceration      Past Medical History:  Diagnosis Date  . Hypertension   . Personal history of colonic polyps 04/03/2010    Patient Active Problem List   Diagnosis Date Noted  . Personal history of colonic polyps 04/03/2010    Past Surgical History:  Procedure Laterality Date  . COLONOSCOPY    . index finger     right       Home Medications    Prior to Admission medications   Medication Sig Start Date End Date Taking? Authorizing Provider  amLODipine (NORVASC) 5 MG tablet Take 5 mg by mouth daily.    Historical Provider, MD  aspirin 81 MG tablet Take by mouth daily.    Historical Provider, MD  cephALEXin (KEFLEX) 500 MG capsule Take 1 capsule (500 mg total) by mouth 4 (four) times daily. 01/02/16   Evalee Jefferson, PA-C  Cholecalciferol (VITAMIN D3) 1000 units CAPS Take 1,000 Units by mouth.    Historical Provider, MD  ibuprofen (ADVIL,MOTRIN) 800 MG tablet Take 1 tablet (800 mg total) by mouth 3 (three) times daily. 11/02/15   Tammy Triplett, PA-C  meloxicam (MOBIC) 15 MG tablet Take 15 mg by mouth daily.    Historical Provider, MD  Omega-3 Fatty Acids (FISH OIL) 1000 MG CAPS Take by mouth.    Historical Provider, MD  traMADol-acetaminophen (ULTRACET) 37.5-325 MG tablet Take 1 tablet by mouth  every 6 (six) hours as needed. 12/05/15   Carole Civil, MD  Vitamins/Minerals TABS Take by mouth.    Historical Provider, MD    Family History Family History  Problem Relation Age of Onset  . Colon cancer Neg Hx     Social History Social History  Substance Use Topics  . Smoking status: Former Research scientist (life sciences)  . Smokeless tobacco: Never Used  . Alcohol use 0.0 oz/week     Comment: OCC     Allergies   Review of patient's allergies indicates no known allergies.   Review of Systems Review of Systems  Constitutional: Negative for chills and fever.  Respiratory: Negative.   Musculoskeletal: Positive for arthralgias.  Skin: Positive for wound.  Neurological: Negative for weakness and numbness.     Physical Exam Updated Vital Signs BP 138/84 (BP Location: Left Arm)   Pulse 95   Temp 98.9 F (37.2 C) (Oral)   Resp 18   Ht 5\' 10"  (1.778 m)   Wt 117.9 kg   SpO2 95%   BMI 37.31 kg/m   Physical Exam  Constitutional: He is oriented to person, place, and time. He appears well-developed and well-nourished.  HENT:  Head: Normocephalic.  Cardiovascular: Normal rate.   Pulmonary/Chest: Effort normal.  Musculoskeletal: He exhibits tenderness.  Hands: Neurological: He is alert and oriented to person, place, and time. No sensory deficit.  Skin: Laceration noted.  Irregular laceration/ skin tear left distal index finger tuft.  Nail intact.  Hemostatic.  Distal sensation intact.     ED Treatments / Results  Labs (all labs ordered are listed, but only abnormal results are displayed) Labs Reviewed - No data to display  EKG  EKG Interpretation None       Radiology Dg Finger Index Left  Result Date: 01/02/2016 CLINICAL DATA:  Injury, possible foreign body EXAM: LEFT INDEX FINGER 2+V COMPARISON:  None. FINDINGS: Three views of the left second finger submitted. There is mild comminuted nondisplaced fracture at the tip of distal phalanx. There is soft tissue irregularity  injury at the tip of the finger. No radiopaque foreign body is identified. IMPRESSION: Mild comminuted nondisplaced fracture at the tip of distal phalanx. Soft tissue injury at the tip of the finger. Electronically Signed   By: Lahoma Crocker M.D.   On: 01/02/2016 18:44    Procedures Procedures (including critical care time)  LACERATION REPAIR Performed by: Evalee Jefferson Authorized by: Evalee Jefferson Consent: Verbal consent obtained. Risks and benefits: risks, benefits and alternatives were discussed Consent given by: patient Patient identity confirmed: provided demographic data Prepped and Draped in normal sterile fashion Wound explored with no foreign body appreciated.  His wound was soaked in saline and Betadine solution followed by 4 x 4 scrub.  Laceration Location: left index finger  Laceration Length: 0.75 cm  No Foreign Bodies seen or palpated  Anesthesia: digital block  Local anesthetic: marcaine 0.5%    Anesthetic total: 2 ml  Irrigation method: syringe Amount of cleaning: standard  Skin closure: sterile strips for close approximation  Number of sutures: na  Technique: sterile strips  Patient tolerance: Patient tolerated the procedure well with no immediate complications.   Medications Ordered in ED Medications  bupivacaine (MARCAINE) 0.5 % injection 10 mL (not administered)  cephALEXin (KEFLEX) capsule 500 mg (500 mg Oral Given 01/02/16 2000)     Initial Impression / Assessment and Plan / ED Course  I have reviewed the triage vital signs and the nursing notes.  Pertinent labs & imaging results that were available during my care of the patient were reviewed by me and considered in my medical decision making (see chart for details).  Clinical Course    Discussed with Dr. Aline Brochure who will see patient in follow-up in 4 days.  Patient is aware of plan and will call for an appointment time.  He was placed in a dressing, finger splint applied.  Advised continued ice  and elevation.  Watch closely for signs of infection.  He is placed on Keflex with first dose given here.  Final Clinical Impressions(s) / ED Diagnoses   Final diagnoses:  Laceration of finger, initial encounter  Phalanx (hand) fracture, open, initial encounter    New Prescriptions Discharge Medication List as of 01/02/2016  7:48 PM       Evalee Jefferson, PA-C 01/02/16 2044    Nat Christen, MD 01/02/16 2309

## 2016-01-06 ENCOUNTER — Encounter: Payer: Self-pay | Admitting: Orthopedic Surgery

## 2016-01-06 ENCOUNTER — Ambulatory Visit (INDEPENDENT_AMBULATORY_CARE_PROVIDER_SITE_OTHER): Payer: Medicare Other | Admitting: Orthopedic Surgery

## 2016-01-06 VITALS — BP 132/86 | HR 81 | Ht 70.0 in | Wt 270.0 lb

## 2016-01-06 DIAGNOSIS — S62639A Displaced fracture of distal phalanx of unspecified finger, initial encounter for closed fracture: Secondary | ICD-10-CM | POA: Diagnosis not present

## 2016-01-06 NOTE — Patient Instructions (Signed)
SOAK FINGER WARM WATER SALT DISH DETERGENT

## 2016-01-06 NOTE — Progress Notes (Signed)
New problem crush injury left hand date of injury September 7  The patient had a saw kicked back and injured his left hand he had a crush injury. He was seen in the ER. They applied some Steri-Strips she's on oral antibiotics  Complains of pain swelling and some stiffness in the DIP joint but overall otherwise doing well  I'm also following him for osteoarthritis left knee   Review of Systems  Constitutional: Negative for chills and fever.  Neurological: Negative for tingling.    Past Medical History:  Diagnosis Date  . Hypertension   . Personal history of colonic polyps 04/03/2010   BP 132/86   Pulse 81   Ht 5\' 10"  (1.778 m)   Wt 270 lb (122.5 kg)   BMI 38.74 kg/m  Physical Exam  Constitutional: He is oriented to person, place, and time. He appears well-developed and well-nourished. No distress.  Cardiovascular: Normal rate and intact distal pulses.   Neurological: He is alert and oriented to person, place, and time.  Skin: Skin is warm and dry. No rash noted. He is not diaphoretic. No erythema. No pallor.  Psychiatric: He has a normal mood and affect. His behavior is normal. Judgment and thought content normal.   Ambulation status fairly good ambulation with a knee brace on his left knee no noticeable limp  Evaluation the left index finger shows a subungual hematoma and a hematoma also in the pulp space with mild surrounding swelling. He has 20 of flexion of the DIP joint in full extension  It is tender to touch. Sensory exam is normal capillary refill cannot be determined because of the swelling and the bruising on the skin which is intact with Steri-Strips. Stability tests are deferred flexor tendon strength is normal skin as described. Lymph nodes in the region elbow area normal  In comparison to his opposite finger there is no gross malalignment  The x-ray shows a crush injury to the distal phalanx of the left index finger  Plan local wound care  Follow-up on  Wednesday when we see him for his knee arthritis  Diagnosis distal phalanx fracture left index finger

## 2016-01-15 ENCOUNTER — Encounter: Payer: Self-pay | Admitting: Orthopedic Surgery

## 2016-01-15 ENCOUNTER — Ambulatory Visit (INDEPENDENT_AMBULATORY_CARE_PROVIDER_SITE_OTHER): Payer: Medicare Other | Admitting: Orthopedic Surgery

## 2016-01-15 VITALS — BP 139/86 | HR 85 | Ht 70.0 in | Wt 272.0 lb

## 2016-01-15 DIAGNOSIS — S83242D Other tear of medial meniscus, current injury, left knee, subsequent encounter: Secondary | ICD-10-CM

## 2016-01-15 DIAGNOSIS — M8430XA Stress fracture, unspecified site, initial encounter for fracture: Secondary | ICD-10-CM

## 2016-01-15 DIAGNOSIS — S62639A Displaced fracture of distal phalanx of unspecified finger, initial encounter for closed fracture: Secondary | ICD-10-CM

## 2016-01-15 NOTE — Patient Instructions (Signed)
Home exercises Brace can come off

## 2016-01-15 NOTE — Progress Notes (Signed)
Chief Complaint  Patient presents with  . Follow-up    LEFT KNEE PAIN, LEFT INDEX FINGER    Patient had an MRI of his left knee about 8 weeks ago your knee brace for stress fracture in his tear arthritis.  He then injured his left index finger the distal phalanx closed fracture and we saw him for that on 01/06/2016  He presents for follow-up on both  He says his left knee is improved he has no mechanical symptoms she does have some difficulty bending the knee all the way back  His finger other than some nail bed hematoma and some mild swelling has basically essentially returned to normal  Review of Systems  Constitutional: Negative for chills and fever.  Musculoskeletal: Positive for joint pain.   Past Medical History:  Diagnosis Date  . Hypertension   . Personal history of colonic polyps 04/03/2010   BP 139/86   Pulse 85   Ht 5\' 10"  (1.778 m)   Wt 272 lb (123.4 kg)   BMI 39.03 kg/m  Physical Exam  Constitutional: He is oriented to person, place, and time. He appears well-developed and well-nourished. No distress.  Cardiovascular: Normal rate and intact distal pulses.   Neurological: He is alert and oriented to person, place, and time.  Skin: Skin is warm and dry. No rash noted. He is not diaphoretic. No erythema. No pallor.  Psychiatric: He has a normal mood and affect. His behavior is normal. Judgment and thought content normal.   Left index finger nailbed has a hematoma proximally 70% face of the nail. Has intact flexion extension of the DIP and PIP joint with no weakness. Sensation is normal there is mild swelling no erythema.  Left knee tenderness over the medial joint line flexion is 125. Has mild meniscal signs. Ligaments are stable. Distal pulses are intact sensation is normal in the left leg. Alignment is normal  Encounter Diagnoses  Name Primary?  . Distal phalanx or phalanges, closed fracture, initial encounter Yes  . Acute medial meniscus tear of left knee,  subsequent encounter   . Stress reaction of bone, initial encounter     The brace can be removed He is to work on home exercises Follow-up in 4 weeks

## 2016-02-12 ENCOUNTER — Encounter: Payer: Self-pay | Admitting: Orthopedic Surgery

## 2016-02-12 ENCOUNTER — Ambulatory Visit (INDEPENDENT_AMBULATORY_CARE_PROVIDER_SITE_OTHER): Payer: Medicare Other | Admitting: Orthopedic Surgery

## 2016-02-12 DIAGNOSIS — S62661D Nondisplaced fracture of distal phalanx of left index finger, subsequent encounter for fracture with routine healing: Secondary | ICD-10-CM

## 2016-02-12 DIAGNOSIS — S83242A Other tear of medial meniscus, current injury, left knee, initial encounter: Secondary | ICD-10-CM | POA: Diagnosis not present

## 2016-02-12 DIAGNOSIS — M1712 Unilateral primary osteoarthritis, left knee: Secondary | ICD-10-CM | POA: Diagnosis not present

## 2016-02-12 NOTE — Progress Notes (Signed)
Patient ID: Ronald Pitts, male   DOB: 1950/06/22, 65 y.o.   MRN: [id number]  Chief Complaint  Patient presents with  . Follow-up    left knee    HPI Ronald Pitts is a 65 y.o. male.   HPI  Distal phalanx fracture follow-up and stress reaction of bone the medial meniscal tear follow-up  Both finger and knee feel much better for the patient. He says he is 95% better regarding his knee  Review of Systems Review of Systems  No mechanical symptoms in the knee at this time Physical Exam There were no vitals taken for this visit.   Physical Exam  Left index finger the nail is dark looks like it's about to come off his regained most of his motion has mild tenderness  Left knee regained full range of motion. All ligaments are stable. Skin is warm dry and intact without rash. No tenderness or swelling. Strength is normal.  Encounter Diagnoses  Name Primary?  Marland Kitchen Arthritis of left knee Yes  . Acute medial meniscus tear of left knee, initial encounter   . Closed nondisplaced fracture of distal phalanx of left index finger with routine healing, subsequent encounter     Plan resume normal activities follow-up with [place] as needed

## 2017-01-18 IMAGING — MR MR KNEE*L* W/O CM
4 of 7 series · 13 of 40 positions shown · non-contrast
Comparison: Radiographs from 11/02/2015

CLINICAL DATA: Medial knee pain over the past month without known
injury.

EXAM:
MRI OF THE LEFT KNEE WITHOUT CONTRAST
TECHNIQUE: Multiplanar, multisequence MR imaging of the knee was performed. No
intravenous contrast was administered.

[Series 3: pdfs axial · axial · 3.0mm · 0.23mm/px · z∈[-64,+15]mm · 3 of 34 slices shown]
[im 6/34]
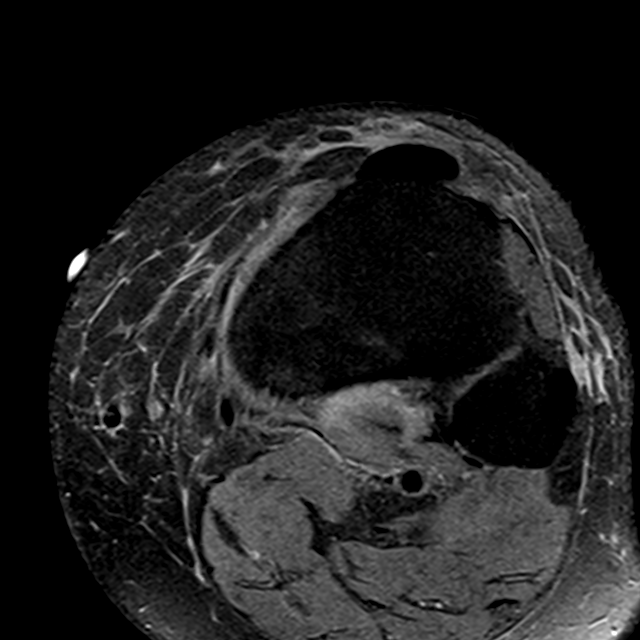
[im 17/34]
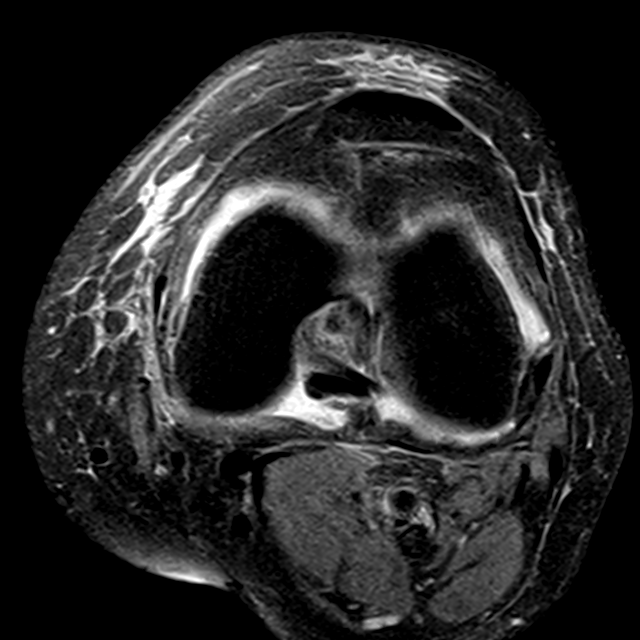
[im 28/34]
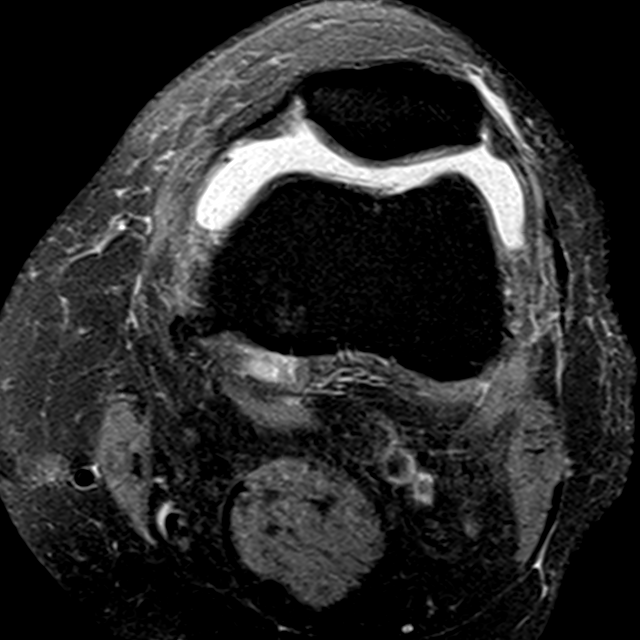

[Series 4: T1 · coronal · 3.0mm · 0.18mm/px · 4 of 29 slices shown]
[im 1/29]
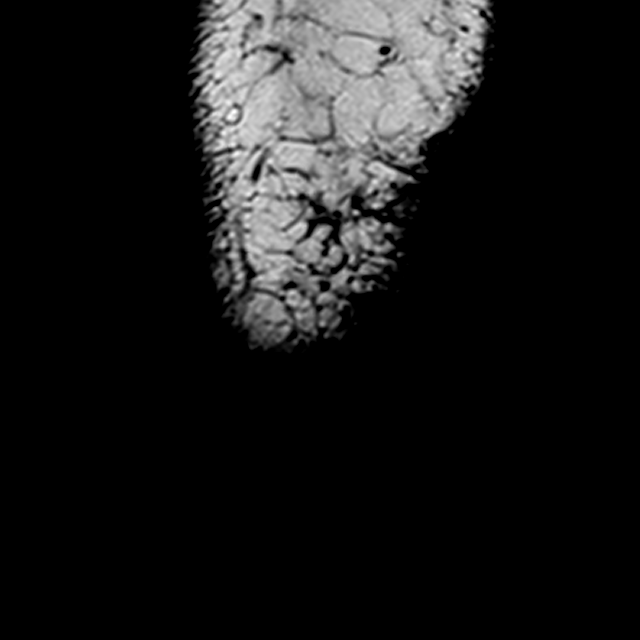
[im 6/29]
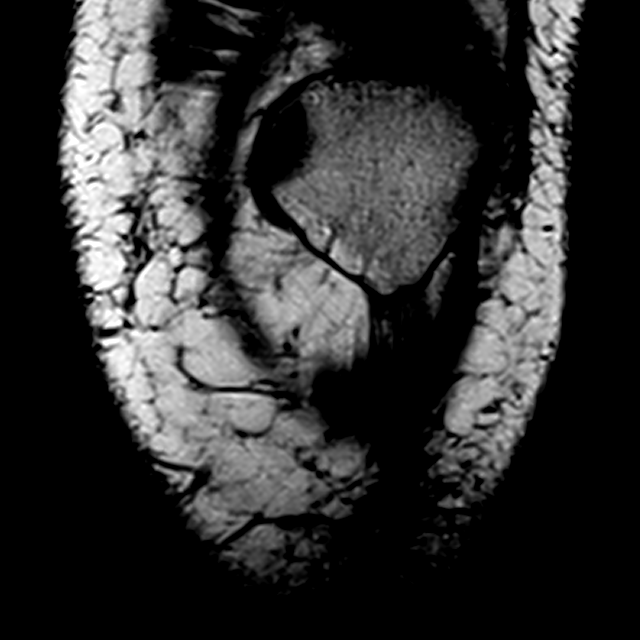
[im 17/29]
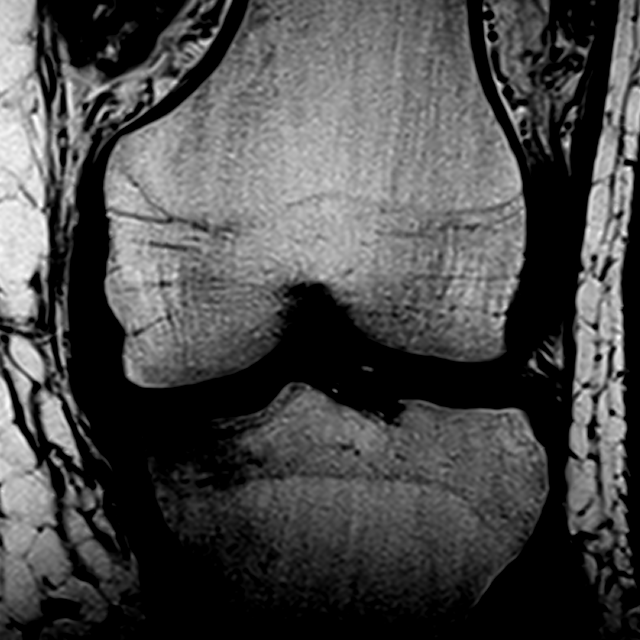
[im 29/29]
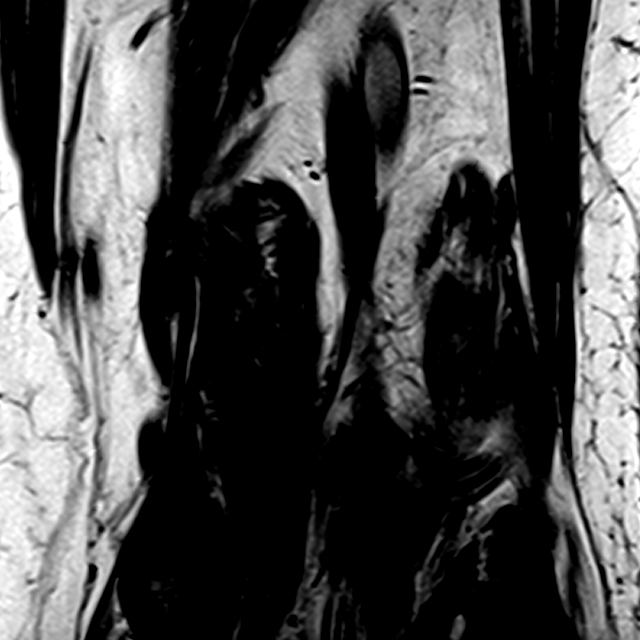

[Series 5: pdfs sag · sagittal · 3.0mm · 0.19mm/px · 3 of 29 slices shown]
[im 6/29]
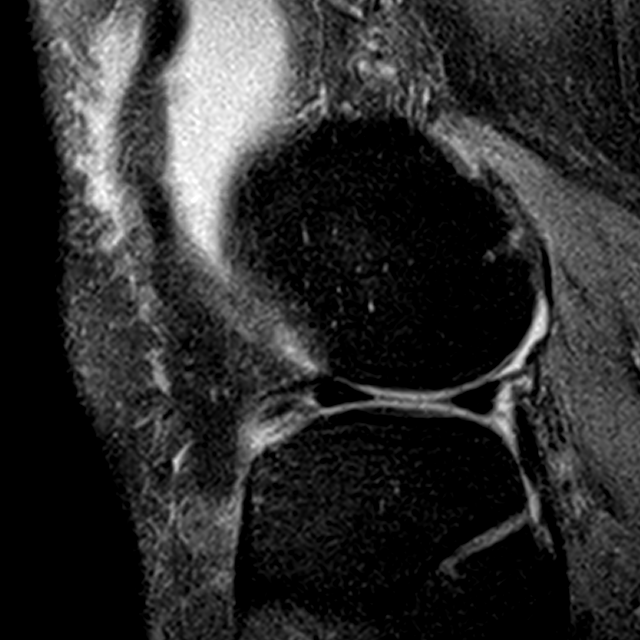
[im 17/29]
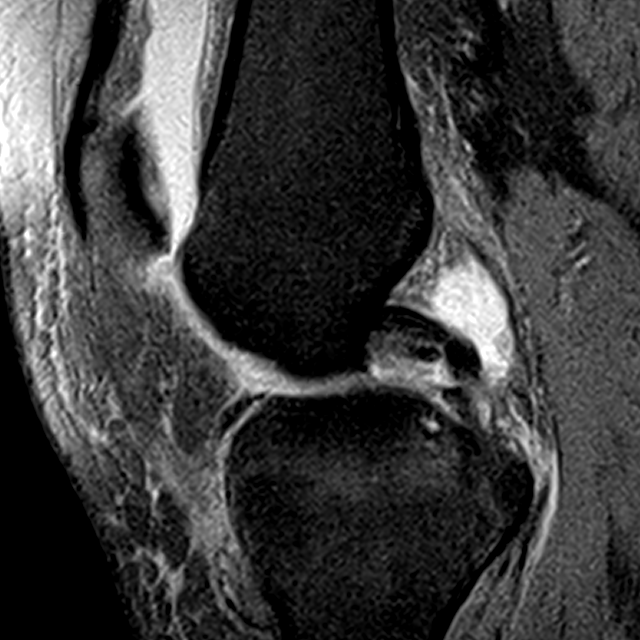
[im 29/29]
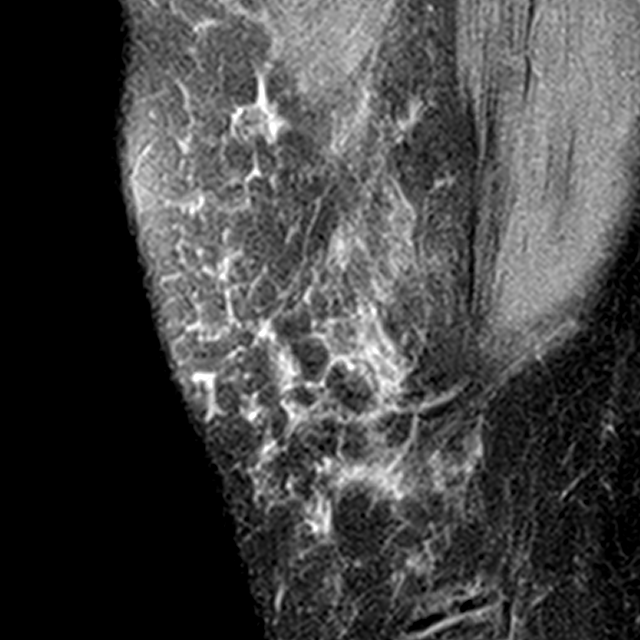

[Series 6: t2fs cor · coronal · 3.0mm · 0.19mm/px · 3 of 29 slices shown]
[im 6/29]
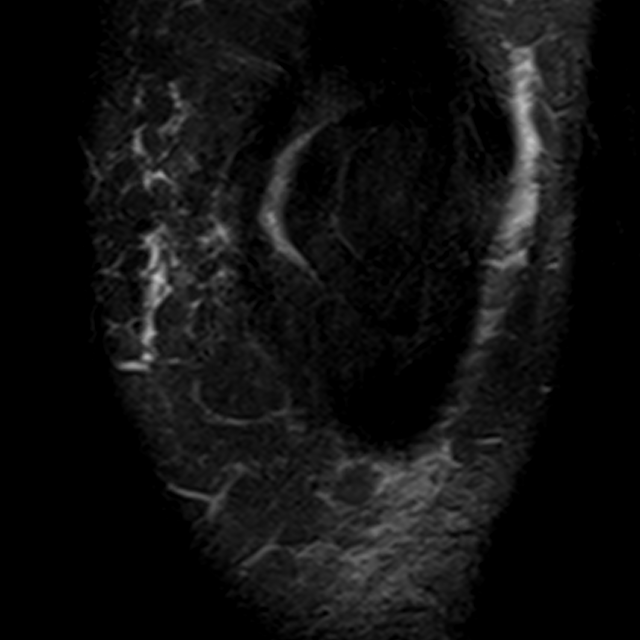
[im 17/29]
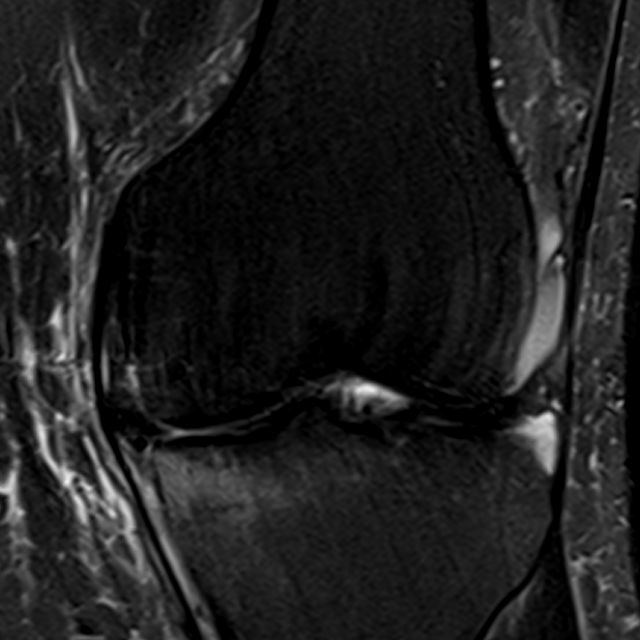
[im 29/29]
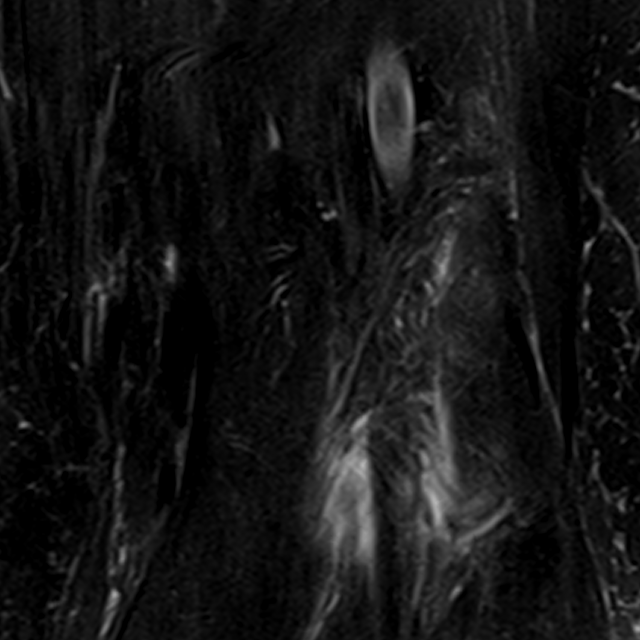

[13 of 40 positions shown; findings below may reference images not displayed]

FINDINGS: Despite efforts by the technologist and patient, motion artifact is
present on today's exam and could not be eliminated. This reduces
exam sensitivity and specificity.

MENISCI

Medial meniscus: Mildly complex tear of the posterior horn medial
meniscus with a superior surface component more laterally and a
radial component closer to the meniscal midbody and potentially
involving the meniscal midbody.

Lateral meniscus:  Unremarkable

LIGAMENTS

Cruciates:  Unremarkable

Collaterals:  Edema tracks along the MCL, possible grade 1 sprain.

CARTILAGE

Patellofemoral:  Mild to moderate degenerative chondral thinning.

Medial: Prominent degenerative chondral thinning along much of the
surface of the medial compartment with osteochondral lesion or small
subcortical stress fracture in the medial tibial plateau on image 12
series 4, with surrounding marrow edema.

Lateral:  Mild degenerative chondral thinning.

Joint:  Moderate to large knee joint effusion.

Popliteal Fossa:  Mild pes anserine bursitis.

Extensor Mechanism:  Prepatellar subcutaneous edema.

Bones: No significant extra-articular osseous abnormalities
identified.

Other: No supplemental non-categorized findings.
IMPRESSION: 1. Complex tear posterior horn medial meniscus with a radial
component near the midbody and a superior surface defect further
posterolaterally.
2. Grade 1 MCL sprain.
3. Prominent chondral thinning in the medial compartment with
osteochondral lesion or small subcortical stress fracture along the
medial tibial plateau. Milder degrees of chondral thinning in the
other compartments.
4. Moderate to large knee effusion.
5. Mild pes anserine bursitis.

## 2018-12-09 ENCOUNTER — Ambulatory Visit: Payer: Self-pay | Admitting: Family Medicine

## 2018-12-22 ENCOUNTER — Other Ambulatory Visit: Payer: Self-pay

## 2018-12-23 ENCOUNTER — Ambulatory Visit: Payer: Self-pay | Admitting: Family Medicine

## 2018-12-26 ENCOUNTER — Ambulatory Visit: Payer: Self-pay | Admitting: Family Medicine

## 2019-01-18 ENCOUNTER — Other Ambulatory Visit: Payer: Self-pay

## 2019-01-19 ENCOUNTER — Encounter: Payer: Self-pay | Admitting: Family Medicine

## 2019-01-19 ENCOUNTER — Ambulatory Visit (INDEPENDENT_AMBULATORY_CARE_PROVIDER_SITE_OTHER): Payer: Medicare Other | Admitting: Family Medicine

## 2019-01-19 VITALS — BP 131/82 | HR 80 | Temp 98.0°F | Ht 70.0 in | Wt 283.0 lb

## 2019-01-19 DIAGNOSIS — M1990 Unspecified osteoarthritis, unspecified site: Secondary | ICD-10-CM

## 2019-01-19 DIAGNOSIS — Z0001 Encounter for general adult medical examination with abnormal findings: Secondary | ICD-10-CM | POA: Diagnosis not present

## 2019-01-19 DIAGNOSIS — Z1159 Encounter for screening for other viral diseases: Secondary | ICD-10-CM | POA: Diagnosis not present

## 2019-01-19 DIAGNOSIS — Z23 Encounter for immunization: Secondary | ICD-10-CM | POA: Diagnosis not present

## 2019-01-19 DIAGNOSIS — R7309 Other abnormal glucose: Secondary | ICD-10-CM | POA: Diagnosis not present

## 2019-01-19 DIAGNOSIS — S81802A Unspecified open wound, left lower leg, initial encounter: Secondary | ICD-10-CM

## 2019-01-19 DIAGNOSIS — J302 Other seasonal allergic rhinitis: Secondary | ICD-10-CM | POA: Diagnosis not present

## 2019-01-19 DIAGNOSIS — Z6841 Body Mass Index (BMI) 40.0 and over, adult: Secondary | ICD-10-CM

## 2019-01-19 DIAGNOSIS — I10 Essential (primary) hypertension: Secondary | ICD-10-CM

## 2019-01-19 DIAGNOSIS — Z0000 Encounter for general adult medical examination without abnormal findings: Secondary | ICD-10-CM

## 2019-01-19 DIAGNOSIS — Z125 Encounter for screening for malignant neoplasm of prostate: Secondary | ICD-10-CM

## 2019-01-19 DIAGNOSIS — Z7689 Persons encountering health services in other specified circumstances: Secondary | ICD-10-CM | POA: Diagnosis not present

## 2019-01-19 MED ORDER — AMLODIPINE BESYLATE 5 MG PO TABS: 5.0000 mg | ORAL_TABLET | Freq: Every day | ORAL | 1 refills | Status: DC

## 2019-01-19 MED ORDER — MELOXICAM 15 MG PO TABS: 15.0000 mg | ORAL_TABLET | Freq: Every day | ORAL | 1 refills | Status: DC

## 2019-01-19 MED ORDER — SHINGRIX 50 MCG/0.5ML IM SUSR: 0.5000 mL | Freq: Once | INTRAMUSCULAR | 0 refills | Status: AC

## 2019-01-19 MED ORDER — FLUTICASONE PROPIONATE 50 MCG/ACT NA SUSP: 2.0000 | Freq: Every day | NASAL | 5 refills | Status: DC

## 2019-01-19 NOTE — Progress Notes (Signed)
New Patient Office Visit  Assessment & Plan:  1. Well adult exam - Preventive care education provided. Calling [facility] to find out which shingles vaccine patient received. If it was Zostavax he is agreeable to Shingrix being sent to the pharmacy for administration.  - CBC with Differential/Platelet - CMP14+EGFR - Lipid panel  2. Essential (primary) hypertension - Well controlled on current regimen.  - amLODipine (NORVASC) 5 MG tablet; Take 1 tablet (5 mg total) by mouth daily.  Dispense: 90 tablet; Refill: 1 - CMP14+EGFR - Lipid panel  3. Wound of left lower extremity, initial encounter - Continue daily wound care until healed.   4. Morbid obesity with BMI of 40.0-44.9, adult ([place]) - Weight loss encouraged. - Lipid panel  5. Seasonal allergies - Well controlled on current regimen.  - fluticasone (FLONASE) 50 MCG/ACT nasal spray; Place 2 sprays into both nostrils daily.  Dispense: 16 g; Refill: 5  6. Arthritis - Well controlled on current regimen.  - meloxicam (MOBIC) 15 MG tablet; Take 1 tablet (15 mg total) by mouth daily.  Dispense: 90 tablet; Refill: 1  7. Encounter for hepatitis C screening test for low risk patient - Hepatitis C antibody  8. Screening PSA (prostate specific antigen) - PR PSA, TOTAL SCREENING  9. Need for immunization against influenza - Flu Vaccine QUAD High Dose(Fluad)   Follow-up: Return in about 1 year (around 01/19/2020) for annual physical.   Hendricks Limes, MSN, APRN, FNP-C [facility]  Subjective:  Patient ID: Quandarius Nill, male    DOB: [birth year]  Age: 68 y.o. MRN: [id number]  Patient Care Team: Loman Brooklyn, FNP as PCP - General (Family Medicine)  CC:  Chief Complaint  Patient presents with  . New Patient (Initial Visit)    Dr. Edrick Oh   . sore on left leg    HPI Hanif Radin presents to establish care. He is transferring care from Dr. Murrell Redden office as he has retired and the office has  closed. Patient also has a sore on his left leg.   Patient reports about a month and a half ago he was getting up on the tractor and the piece he was stepping on broke leading him to slip and scrap his leg. He had an appointment the in a few days so he did not seek treatment. His appointment has been changed a couple of times so today is the first time seeing anyone. He reports the swelling as improved significantly.He does cleanse it once daily with hydrogen peroxide. Denies any fever or drainage. Denies anything getting in it.    Review of Systems  Constitutional: Negative for chills, fever, malaise/fatigue and weight loss.  HENT: Negative for congestion, ear discharge, ear pain, nosebleeds, sinus pain, sore throat and tinnitus.   Eyes: Negative for blurred vision, double vision, pain, discharge and redness.  Respiratory: Negative for cough, shortness of breath and wheezing.   Cardiovascular: Positive for leg swelling. Negative for chest pain and palpitations.  Gastrointestinal: Negative for abdominal pain, constipation, diarrhea, heartburn, nausea and vomiting.  Genitourinary: Negative for dysuria, frequency and urgency.       Denies trouble initiating a urine stream, weak stream, split stream, and dribbling.   Musculoskeletal: Negative for myalgias.  Skin: Negative for rash.  Neurological: Negative for dizziness, seizures, weakness and headaches.  Psychiatric/Behavioral: Negative for depression, substance abuse and suicidal ideas. The patient is not nervous/anxious.     Current Outpatient Medications:  .  amLODipine (NORVASC) 5 MG tablet, Take  1 tablet (5 mg total) by mouth daily., Disp: 90 tablet, Rfl: 1 .  aspirin 81 MG tablet, Take by mouth daily., Disp: , Rfl:  .  fluticasone (FLONASE) 50 MCG/ACT nasal spray, Place 2 sprays into both nostrils daily., Disp: 16 g, Rfl: 5 .  ibuprofen (ADVIL,MOTRIN) 800 MG tablet, Take 1 tablet (800 mg total) by mouth 3 (three) times daily., Disp: 21  tablet, Rfl: 0 .  meloxicam (MOBIC) 15 MG tablet, Take 1 tablet (15 mg total) by mouth daily., Disp: 90 tablet, Rfl: 1 .  Omega-3 Fatty Acids (FISH OIL) 1000 MG CAPS, Take by mouth., Disp: , Rfl:   No Known Allergies  Past Medical History:  Diagnosis Date  . Hypertension   . Personal history of colonic polyps 04/03/2010    Past Surgical History:  Procedure Laterality Date  . COLONOSCOPY    . index finger     right    Family History  Problem Relation Age of Onset  . Colon cancer Neg Hx     Social History   Socioeconomic History  . Marital status: Married    Spouse name: Not on file  . Number of children: Not on file  . Years of education: Not on file  . Highest education level: Not on file  Occupational History  . Not on file  Social Needs  . Financial resource strain: Not on file  . Food insecurity    Worry: Not on file    Inability: Not on file  . Transportation needs    Medical: Not on file    Non-medical: Not on file  Tobacco Use  . Smoking status: Former Research scientist (life sciences)  . Smokeless tobacco: Never Used  Substance and Sexual Activity  . Alcohol use: Yes    Alcohol/week: 0.0 standard drinks    Comment: OCC  . Drug use: No  . Sexual activity: Not on file  Lifestyle  . Physical activity    Days per week: Not on file    Minutes per session: Not on file  . Stress: Not on file  Relationships  . Social Herbalist on phone: Not on file    Gets together: Not on file    Attends religious service: Not on file    Active member of club or organization: Not on file    Attends meetings of clubs or organizations: Not on file    Relationship status: Not on file  . Intimate partner violence    Fear of current or ex partner: Not on file    Emotionally abused: Not on file    Physically abused: Not on file    Forced sexual activity: Not on file  Other Topics Concern  . Not on file  Social History Narrative  . Not on file    Objective:   Today's Vitals: BP  131/82   Pulse 80   Temp 98 F (36.7 C) (Temporal)   Ht _0  (1.778 m)   Wt 283 lb (128.4 kg)   SpO2 94%   BMI 40.61 kg/m   Physical Exam Vitals signs reviewed.  Constitutional:      General: He is not in acute distress.    Appearance: Normal appearance. He is morbidly obese. He is not ill-appearing, toxic-appearing or diaphoretic.  HENT:     Head: Normocephalic and atraumatic.     Right Ear: Tympanic membrane, ear canal and external ear normal. There is no impacted cerumen.     Left Ear: Tympanic membrane,  ear canal and external ear normal. There is no impacted cerumen.     Nose: Nose normal. No congestion or rhinorrhea.     Mouth/Throat:     Mouth: Mucous membranes are moist.     Pharynx: Oropharynx is clear. No oropharyngeal exudate or posterior oropharyngeal erythema.  Eyes:     General: No scleral icterus.       Right eye: No discharge.        Left eye: No discharge.     Conjunctiva/sclera: Conjunctivae normal.     Pupils: Pupils are equal, round, and reactive to light.  Neck:     Musculoskeletal: Normal range of motion and neck supple. No neck rigidity or muscular tenderness.  Cardiovascular:     Rate and Rhythm: Normal rate and regular rhythm.     Heart sounds: Normal heart sounds. No murmur. No friction rub. No gallop.   Pulmonary:     Effort: Pulmonary effort is normal. No respiratory distress.     Breath sounds: Normal breath sounds. No stridor. No wheezing, rhonchi or rales.  Abdominal:     General: Abdomen is flat. Bowel sounds are normal. There is no distension.     Palpations: Abdomen is soft. There is no mass.     Tenderness: There is no abdominal tenderness. There is no guarding or rebound.     Hernia: No hernia is present.  Musculoskeletal: Normal range of motion.     Right lower leg: No edema.     Left lower leg: Edema present.  Lymphadenopathy:     Cervical: No cervical adenopathy.  Skin:    General: Skin is warm and dry.     Capillary Refill:  Capillary refill takes less than 2 seconds.     Findings: Wound (LLE - surrounding erythema blanchable; no warmth, odor, or drainage (image below)) present.  Neurological:     General: No focal deficit present.     Mental Status: He is alert and oriented to person, place, and time. Mental status is at baseline.  Psychiatric:        Mood and Affect: Mood normal.        Behavior: Behavior normal.        Thought Content: Thought content normal.        Judgment: Judgment normal.

## 2019-01-19 NOTE — Addendum Note (Signed)
Addended by: Loman Brooklyn on: 01/19/2019 12:23 PM   Modules accepted: Orders

## 2019-01-19 NOTE — Patient Instructions (Addendum)
Wound Care, Adult Taking care of your wound properly can help to prevent pain, infection, and scarring. It can also help your wound to heal more quickly. How to care for your wound Wound care      Follow instructions from your health care provider about how to take care of your wound. Make sure you: ? Wash your hands with soap and water before you change the bandage (dressing). If soap and water are not available, use hand sanitizer. ? Change your dressing as told by your health care provider. ? Leave stitches (sutures), skin glue, or adhesive strips in place. These skin closures may need to stay in place for 2 weeks or longer. If adhesive strip edges start to loosen and curl up, you may trim the loose edges. Do not remove adhesive strips completely unless your health care provider tells you to do that.  Check your wound area every day for signs of infection. Check for: ? Redness, swelling, or pain. ? Fluid or blood. ? Warmth. ? Pus or a bad smell.  Ask your health care provider if you should clean the wound with mild soap and water. Doing this may include: ? Using a clean towel to pat the wound dry after cleaning it. Do not rub or scrub the wound. ? Applying a cream or ointment. Do this only as told by your health care provider. ? Covering the incision with a clean dressing.  Ask your health care provider when you can leave the wound uncovered.  Keep the dressing dry until your health care provider says it can be removed. Do not take baths, swim, use a hot tub, or do anything that would put the wound underwater until your health care provider approves. Ask your health care provider if you can take showers. You may only be allowed to take sponge baths. Medicines   If you were prescribed an antibiotic medicine, cream, or ointment, take or use the antibiotic as told by your health care provider. Do not stop taking or using the antibiotic even if your condition improves.  Take  over-the-counter and prescription medicines only as told by your health care provider. If you were prescribed pain medicine, take it 30 or more minutes before you do any wound care or as told by your health care provider. General instructions  Return to your normal activities as told by your health care provider. Ask your health care provider what activities are safe.  Do not scratch or pick at the wound.  Do not use any products that contain nicotine or tobacco, such as cigarettes and e-cigarettes. These may delay wound healing. If you need help quitting, ask your health care provider.  Keep all follow-up visits as told by your health care provider. This is important.  Eat a diet that includes protein, vitamin A, vitamin C, and other nutrient-rich foods to help the wound heal. ? Foods rich in protein include meat, dairy, beans, nuts, and other sources. ? Foods rich in vitamin A include carrots and dark green, leafy vegetables. ? Foods rich in vitamin C include citrus, tomatoes, and other fruits and vegetables. ? Nutrient-rich foods have protein, carbohydrates, fat, vitamins, or minerals. Eat a variety of healthy foods including vegetables, fruits, and whole grains. Contact a health care provider if:  You received a tetanus shot and you have swelling, severe pain, redness, or bleeding at the injection site.  Your pain is not controlled with medicine.  You have redness, swelling, or pain around the wound.    You have fluid or blood coming from the wound.  Your wound feels warm to the touch.  You have pus or a bad smell coming from the wound.  You have a fever or chills.  You are nauseous or you vomit.  You are dizzy. Get help right away if:  You have a red streak going away from your wound.  The edges of the wound open up and separate.  Your wound is bleeding, and the bleeding does not stop with gentle pressure.  You have a rash.  You faint.  You have trouble breathing.  Summary  Always wash your hands with soap and water before changing your bandage (dressing).  To help with healing, eat foods that are rich in protein, vitamin A, vitamin C, and other nutrients.  Check your wound every day for signs of infection. Contact your health care provider if you suspect that your wound is infected. This information is not intended to replace advice given to you by your health care provider. Make sure you discuss any questions you have with your health care provider. Document Released: 01/21/2008 Document Revised: 08/01/2018 Document Reviewed: 10/29/2015 Elsevier Patient Education  [place] 65 Years and Older, Male Preventive care refers to lifestyle choices and visits with your health care provider that can promote health and wellness. This includes:  A yearly physical exam. This is also called an annual well check.  Regular dental and eye exams.  Immunizations.  Screening for certain conditions.  Healthy lifestyle choices, such as diet and exercise. What can I expect for my preventive care visit? Physical exam Your health care provider will check:  Height and weight. These may be used to calculate body mass index (BMI), which is a measurement that tells if you are at a healthy weight.  Heart rate and blood pressure.  Your skin for abnormal spots. Counseling Your health care provider may ask you questions about:  Alcohol, tobacco, and drug use.  Emotional well-being.  Home and relationship well-being.  Sexual activity.  Eating habits.  History of falls.  Memory and ability to understand (cognition).  Work and work Statistician. What immunizations do I need?  Influenza (flu) vaccine  This is recommended every year. Tetanus, diphtheria, and pertussis (Tdap) vaccine  You may need a Td booster every 10 years. Varicella (chickenpox) vaccine  You may need this vaccine if you have not already been vaccinated.  Zoster (shingles) vaccine  You may need this after age 3. Pneumococcal conjugate (PCV13) vaccine  One dose is recommended after age 47. Pneumococcal polysaccharide (PPSV23) vaccine  One dose is recommended after age 12. Measles, mumps, and rubella (MMR) vaccine  You may need at least one dose of MMR if you were born in 1957 or later. You may also need a second dose. Meningococcal conjugate (MenACWY) vaccine  You may need this if you have certain conditions. Hepatitis A vaccine  You may need this if you have certain conditions or if you travel or work in places where you may be exposed to hepatitis A. Hepatitis B vaccine  You may need this if you have certain conditions or if you travel or work in places where you may be exposed to hepatitis B. Haemophilus influenzae type b (Hib) vaccine  You may need this if you have certain conditions. You may receive vaccines as individual doses or as more than one vaccine together in one shot (combination vaccines). Talk with your health care provider about the risks  and benefits of combination vaccines. What tests do I need? Blood tests  Lipid and cholesterol levels. These may be checked every 5 years, or more frequently depending on your overall health.  Hepatitis C test.  Hepatitis B test. Screening  Lung cancer screening. You may have this screening every year starting at age 49 if you have a 30-pack-year history of smoking and currently smoke or have quit within the past 15 years.  Colorectal cancer screening. All adults should have this screening starting at age 33 and continuing until age 21. Your health care provider may recommend screening at age 11 if you are at increased risk. You will have tests every 1-10 years, depending on your results and the type of screening test.  Prostate cancer screening. Recommendations will vary depending on your family history and other risks.  Diabetes screening. This is done by checking your  blood sugar (glucose) after you have not eaten for a while (fasting). You may have this done every 1-3 years.  Abdominal aortic aneurysm (AAA) screening. You may need this if you are a current or former smoker.  Sexually transmitted disease (STD) testing. Follow these instructions at home: Eating and drinking  Eat a diet that includes fresh fruits and vegetables, whole grains, lean protein, and low-fat dairy products. Limit your intake of foods with high amounts of sugar, saturated fats, and salt.  Take vitamin and mineral supplements as recommended by your health care provider.  Do not drink alcohol if your health care provider tells you not to drink.  If you drink alcohol: ? Limit how much you have to 0-2 drinks a day. ? Be aware of how much alcohol is in your drink. In the [place] one drink equals one 12 oz bottle of beer (355 mL), one 5 oz glass of wine (148 mL), or one 1 oz glass of hard liquor (44 mL). Lifestyle  Take daily care of your teeth and gums.  Stay active. Exercise for at least 30 minutes on 5 or more days each week.  Do not use any products that contain nicotine or tobacco, such as cigarettes, e-cigarettes, and chewing tobacco. If you need help quitting, ask your health care provider.  If you are sexually active, practice safe sex. Use a condom or other form of protection to prevent STIs (sexually transmitted infections).  Talk with your health care provider about taking a low-dose aspirin or statin. What's next?  Visit your health care provider once a year for a well check visit.  Ask your health care provider how often you should have your eyes and teeth checked.  Stay up to date on all vaccines. This information is not intended to replace advice given to you by your health care provider. Make sure you discuss any questions you have with your health care provider. Document Released: 05/10/2015 Document Revised: 04/07/2018 Document Reviewed: 04/07/2018 Elsevier  Patient Education  2020 [employer].

## 2019-01-20 LAB — HEPATITIS C ANTIBODY: Hep C Virus Ab: <0.1 s/co ratio (ref 0.0–0.9)

## 2019-01-20 LAB — CBC WITH DIFFERENTIAL/PLATELET
Basophils Absolute: 0 10*3/uL (ref 0.0–0.2)
Basos: 1 %
EOS (ABSOLUTE): 0.2 10*3/uL (ref 0.0–0.4)
Eos: 3 %
Hematocrit: 49.2 % (ref 37.5–51.0)
Hemoglobin: 16.1 g/dL (ref 13.0–17.7)
Immature Grans (Abs): 0.1 10*3/uL (ref 0.0–0.1)
Immature Granulocytes: 1 %
Lymphocytes Absolute: 1.7 10*3/uL (ref 0.7–3.1)
Lymphs: 22 %
MCH: 29.1 pg (ref 26.6–33.0)
MCHC: 32.7 g/dL (ref 31.5–35.7)
MCV: 89 fL (ref 79–97)
Monocytes Absolute: 0.6 10*3/uL (ref 0.1–0.9)
Monocytes: 8 %
Neutrophils Absolute: 4.9 10*3/uL (ref 1.4–7.0)
Neutrophils: 65 %
Platelets: 247 10*3/uL (ref 150–450)
RBC: 5.54 x10E6/uL (ref 4.14–5.80)
RDW: 12.2 % (ref 11.6–15.4)
WBC: 7.5 10*3/uL (ref 3.4–10.8)

## 2019-01-20 LAB — CMP14+EGFR
ALT: 18 IU/L (ref 0–44)
AST: 25 IU/L (ref 0–40)
Albumin/Globulin Ratio: 1.5 (ref 1.2–2.2)
Albumin: 4.1 g/dL (ref 3.8–4.8)
Alkaline Phosphatase: 97 IU/L (ref 39–117)
BUN/Creatinine Ratio: 13 (ref 10–24)
BUN: 13 mg/dL (ref 8–27)
Bilirubin Total: 0.5 mg/dL (ref 0.0–1.2)
CO2: 28 mmol/L (ref 20–29)
Calcium: 9 mg/dL (ref 8.6–10.2)
Chloride: 101 mmol/L (ref 96–106)
Creatinine, Ser: 1.03 mg/dL (ref 0.76–1.27)
GFR calc Af Amer: 85 mL/min/{1.73_m2} (ref >59)
GFR calc non Af Amer: 73 mL/min/{1.73_m2} (ref >59)
Globulin, Total: 2.7 g/dL (ref 1.5–4.5)
Glucose: 127 mg/dL — ABNORMAL HIGH (ref 65–99)
Potassium: 4.6 mmol/L (ref 3.5–5.2)
Sodium: 140 mmol/L (ref 134–144)
Total Protein: 6.8 g/dL (ref 6.0–8.5)

## 2019-01-20 LAB — LIPID PANEL
Chol/HDL Ratio: 3 ratio (ref 0.0–5.0)
Cholesterol, Total: 124 mg/dL (ref 100–199)
HDL: 42 mg/dL (ref >39)
LDL Chol Calc (NIH): 65 mg/dL (ref 0–99)
Triglycerides: 86 mg/dL (ref 0–149)
VLDL Cholesterol Cal: 17 mg/dL (ref 5–40)

## 2019-01-25 ENCOUNTER — Encounter: Payer: Self-pay | Admitting: Family Medicine

## 2019-01-25 DIAGNOSIS — R7303 Prediabetes: Secondary | ICD-10-CM | POA: Insufficient documentation

## 2019-01-25 LAB — HGB A1C W/O EAG: Hgb A1c MFr Bld: 6.2 % — ABNORMAL HIGH (ref 4.8–5.6)

## 2019-01-25 LAB — SPECIMEN STATUS REPORT

## 2019-05-17 ENCOUNTER — Ambulatory Visit: Payer: Medicare Other | Attending: Internal Medicine

## 2019-05-17 DIAGNOSIS — Z23 Encounter for immunization: Secondary | ICD-10-CM | POA: Insufficient documentation

## 2019-05-17 NOTE — Progress Notes (Signed)
   Covid-19 Vaccination Clinic  Name:  Ronald Pitts    MRN: [id number] DOB: [birth year]  05/17/2019  Mr. Bregman was observed post Covid-19 immunization for 15 minutes without incidence. He was provided with Vaccine Information Sheet and instruction to access the V-Safe system.   Mr. Fluharty was instructed to call 911 with any severe reactions post vaccine: Marland Kitchen Difficulty breathing  . Swelling of your face and throat  . A fast heartbeat  . A bad rash all over your body  . Dizziness and weakness    Immunizations Administered    Name Date Dose VIS Date Route   Pfizer COVID-19 Vaccine 05/17/2019  2:28 PM 0.3 mL 04/07/2019 Intramuscular   Manufacturer: [place]   Lot: [id number]   [place]: [id number]

## 2019-06-07 ENCOUNTER — Ambulatory Visit: Payer: Medicare Other | Attending: Internal Medicine

## 2019-06-07 DIAGNOSIS — Z23 Encounter for immunization: Secondary | ICD-10-CM | POA: Insufficient documentation

## 2019-06-07 NOTE — Progress Notes (Signed)
   Covid-19 Vaccination Clinic  Name:  Shintaro Dietert    MRN: [id number] DOB: 22-Oct-1950  06/07/2019  Mr. Aurich was observed post Covid-19 immunization for 15 minutes without incidence. He was provided with Vaccine Information Sheet and instruction to access the V-Safe system.   Mr. Halvorson was instructed to call 911 with any severe reactions post vaccine: Marland Kitchen Difficulty breathing  . Swelling of your face and throat  . A fast heartbeat  . A bad rash all over your body  . Dizziness and weakness    Immunizations Administered    Name Date Dose VIS Date Route   Pfizer COVID-19 Vaccine 06/07/2019  8:19 AM 0.3 mL 04/07/2019 Intramuscular   Manufacturer: [place]   Lot: [id number]   [place]: [id number]

## 2019-07-10 ENCOUNTER — Other Ambulatory Visit: Payer: Self-pay | Admitting: Family Medicine

## 2019-07-10 DIAGNOSIS — M1990 Unspecified osteoarthritis, unspecified site: Secondary | ICD-10-CM

## 2019-07-10 NOTE — Telephone Encounter (Signed)
OV 01/19/19 RTC 1 yr

## 2019-11-09 ENCOUNTER — Other Ambulatory Visit: Payer: Self-pay | Admitting: Family Medicine

## 2019-11-09 DIAGNOSIS — I10 Essential (primary) hypertension: Secondary | ICD-10-CM

## 2020-01-03 ENCOUNTER — Other Ambulatory Visit: Payer: Self-pay | Admitting: Family Medicine

## 2020-01-03 DIAGNOSIS — M1990 Unspecified osteoarthritis, unspecified site: Secondary | ICD-10-CM

## 2020-01-05 ENCOUNTER — Ambulatory Visit (INDEPENDENT_AMBULATORY_CARE_PROVIDER_SITE_OTHER): Payer: Medicare Other | Admitting: Family Medicine

## 2020-01-05 ENCOUNTER — Encounter: Payer: Self-pay | Admitting: Family Medicine

## 2020-01-05 ENCOUNTER — Other Ambulatory Visit: Payer: Self-pay

## 2020-01-05 VITALS — BP 142/85 | HR 81 | Temp 97.9°F | Ht 70.0 in | Wt 284.6 lb

## 2020-01-05 DIAGNOSIS — R21 Rash and other nonspecific skin eruption: Secondary | ICD-10-CM | POA: Diagnosis not present

## 2020-01-05 MED ORDER — TRIAMCINOLONE ACETONIDE 0.1 % EX CREA: 1.0000 "application " | TOPICAL_CREAM | Freq: Two times a day (BID) | CUTANEOUS | 0 refills | Status: DC

## 2020-01-05 NOTE — Progress Notes (Signed)
   Assessment & Plan:  1. Rash - Area of concern looks like insect bites.  - triamcinolone cream (KENALOG) 0.1 %; Apply 1 application topically 2 (two) times daily.  Dispense: 80 g; Refill: 0   Follow up plan: Return if symptoms worsen or fail to improve.  Hendricks Limes, MSN, APRN, FNP-C [facility]  Subjective:   Patient ID: Ronald Pitts, male    DOB: 1951-04-02, 69 y.o.   MRN: [id number]  HPI: Ronald Pitts is a 69 y.o. male presenting on 01/05/2020 for Rash (Patient states he has been having bilateral rash on high lower legs x 2 days.)  Patient reports a rash on both lower extremities that started two days ago. It does itch but he states he has not been scratching it. He has been outside a lot.    ROS: Negative unless specifically indicated above in HPI.   Relevant past medical history reviewed and updated as indicated.   Allergies and medications reviewed and updated.   Current Outpatient Medications:  .  amLODipine (NORVASC) 5 MG tablet, TAKE 1 TABLET BY MOUTH EVERY DAY, Disp: 90 tablet, Rfl: 0 .  aspirin 81 MG tablet, Take by mouth daily., Disp: , Rfl:  .  fluticasone (FLONASE) 50 MCG/ACT nasal spray, Place 2 sprays into both nostrils daily., Disp: 16 g, Rfl: 5 .  ibuprofen (ADVIL,MOTRIN) 800 MG tablet, Take 1 tablet (800 mg total) by mouth 3 (three) times daily., Disp: 21 tablet, Rfl: 0 .  meloxicam (MOBIC) 15 MG tablet, TAKE 1 TABLET BY MOUTH EVERY DAY, Disp: 90 tablet, Rfl: 0 .  Omega-3 Fatty Acids (FISH OIL) 1000 MG CAPS, Take by mouth., Disp: , Rfl:   No Known Allergies  Objective:   BP (!) 142/85   Pulse 81   Temp 97.9 F (36.6 C) (Temporal)   Ht 5\' 10"  (1.778 m)   Wt 284 lb 9.6 oz (129.1 kg)   SpO2 95%   BMI 40.84 kg/m    Physical Exam Vitals reviewed.  Constitutional:      General: He is not in acute distress.    Appearance: Normal appearance. He is not ill-appearing, toxic-appearing or diaphoretic.  HENT:     Head:  Normocephalic and atraumatic.  Eyes:     General: No scleral icterus.       Right eye: No discharge.        Left eye: No discharge.     Conjunctiva/sclera: Conjunctivae normal.  Cardiovascular:     Rate and Rhythm: Normal rate.  Pulmonary:     Effort: Pulmonary effort is normal. No respiratory distress.  Musculoskeletal:        General: Normal range of motion.     Cervical back: Normal range of motion.  Skin:    General: Skin is warm and dry.     Findings: Rash present. Rash is papular (small areas inside of RLE, inside/posterior of LLE).  Neurological:     Mental Status: He is alert and oriented to person, place, and time. Mental status is at baseline.  Psychiatric:        Mood and Affect: Mood normal.        Behavior: Behavior normal.        Thought Content: Thought content normal.        Judgment: Judgment normal.

## 2020-01-23 ENCOUNTER — Ambulatory Visit (INDEPENDENT_AMBULATORY_CARE_PROVIDER_SITE_OTHER): Payer: Medicare Other | Admitting: Family Medicine

## 2020-01-23 ENCOUNTER — Other Ambulatory Visit: Payer: Self-pay

## 2020-01-23 ENCOUNTER — Encounter: Payer: Self-pay | Admitting: Family Medicine

## 2020-01-23 VITALS — BP 129/83 | HR 72 | Temp 97.5°F | Ht 70.0 in | Wt 284.0 lb

## 2020-01-23 DIAGNOSIS — R600 Localized edema: Secondary | ICD-10-CM

## 2020-01-23 DIAGNOSIS — I10 Essential (primary) hypertension: Secondary | ICD-10-CM

## 2020-01-23 DIAGNOSIS — Z0000 Encounter for general adult medical examination without abnormal findings: Secondary | ICD-10-CM

## 2020-01-23 DIAGNOSIS — Z6841 Body Mass Index (BMI) 40.0 and over, adult: Secondary | ICD-10-CM

## 2020-01-23 DIAGNOSIS — R7303 Prediabetes: Secondary | ICD-10-CM | POA: Diagnosis not present

## 2020-01-23 DIAGNOSIS — Z23 Encounter for immunization: Secondary | ICD-10-CM

## 2020-01-23 DIAGNOSIS — Z0001 Encounter for general adult medical examination with abnormal findings: Secondary | ICD-10-CM | POA: Diagnosis not present

## 2020-01-23 LAB — BAYER DCA HB A1C WAIVED: HB A1C (BAYER DCA - WAIVED): 5.9 % (ref <7.0)

## 2020-01-23 MED ORDER — AMLODIPINE BESYLATE 5 MG PO TABS: 5.0000 mg | ORAL_TABLET | Freq: Every day | ORAL | 3 refills | Status: DC

## 2020-01-23 NOTE — Progress Notes (Signed)
Assessment & Plan:  1. Well adult exam - Preventive health education provided. - CBC with Differential/Platelet - CMP14+EGFR - Lipid panel - TSH  2. Essential (primary) hypertension - Well controlled on current regimen.  - amLODipine (NORVASC) 5 MG tablet; Take 1 tablet (5 mg total) by mouth daily.  Dispense: 90 tablet; Refill: 3 - CBC with Differential/Platelet - CMP14+EGFR - Lipid panel  3. Prediabetes - Diet controlled. - Bayer DCA Hb A1c Waived  4. Morbid obesity with BMI of 40.0-44.9, adult ([place]) - Diet and exercise encouraged.  - CBC with Differential/Platelet - CMP14+EGFR - Lipid panel - TSH - Bayer DCA Hb A1c Waived  5. Bilateral lower extremity edema - Discussed if lab work if normal sending a prescription for HCTZ.  - CBC with Differential/Platelet - CMP14+EGFR - TSH  6. Need for immunization against influenza - Flu Vaccine QUAD High Dose(Fluad)   Follow-up: Return in about 1 year (around 01/22/2021) for annual physical.   Hendricks Limes, MSN, APRN, FNP-C [facility]  Subjective:  Patient ID: Ronald Pitts, male    DOB: [birth year]  Age: 69 y.o. MRN: [id number]  Patient Care Team: Loman Brooklyn, FNP as PCP - General (Family Medicine)   CC:  Chief Complaint  Patient presents with  . Annual Exam    HPI Ronald Pitts presents for his annual physical  Occupation: Retired, Marital status: Married, Substance use: Denies Diet: Low-carb, Exercise: Yard work Last colonoscopy: 05/08/2015 Lung Cancer Screening with low-dose Chest CT: Declined AAA Screening: Declined Hepatitis C Screening: Completed PSA: 1.6 on 03/07/2018 Immunizations: Flu Vaccine: will get today Tdap Vaccine: up to date  COVID-19 Vaccine: up to date Pneumonia Vaccine: up to date  [place] 2/9 Scores 01/23/2020 01/05/2020 01/19/2019  PHQ - 2 Score 0 0 0     Review of Systems  Constitutional: Negative for chills, fever, malaise/fatigue and  weight loss.  HENT: Negative for congestion, ear discharge, ear pain, nosebleeds, sinus pain, sore throat and tinnitus.   Eyes: Negative for blurred vision, double vision, pain, discharge and redness.  Respiratory: Positive for shortness of breath. Negative for cough and wheezing.   Cardiovascular: Positive for leg swelling. Negative for chest pain and palpitations.  Gastrointestinal: Negative for abdominal pain, constipation, diarrhea, heartburn, nausea and vomiting.  Genitourinary: Negative for dysuria, frequency and urgency.  Musculoskeletal: Negative for myalgias.  Skin: Negative for rash.  Neurological: Negative for dizziness, seizures, weakness and headaches.  Psychiatric/Behavioral: Negative for depression, substance abuse and suicidal ideas. The patient is not nervous/anxious.     Current Outpatient Medications:  .  amLODipine (NORVASC) 5 MG tablet, TAKE 1 TABLET BY MOUTH EVERY DAY, Disp: 90 tablet, Rfl: 0 .  aspirin 81 MG tablet, Take by mouth daily., Disp: , Rfl:  .  fluticasone (FLONASE) 50 MCG/ACT nasal spray, Place 2 sprays into both nostrils daily., Disp: 16 g, Rfl: 5 .  triamcinolone cream (KENALOG) 0.1 %, Apply 1 application topically 2 (two) times daily., Disp: 80 g, Rfl: 0  No Known Allergies  Past Medical History:  Diagnosis Date  . Hypertension   . Personal history of colonic polyps 04/03/2010  . Prediabetes     Past Surgical History:  Procedure Laterality Date  . COLONOSCOPY    . index finger     right    Family History  Problem Relation Age of Onset  . Colon cancer Neg Hx     Social History   Socioeconomic History  . Marital status: Married  Spouse name: Not on file  . Number of children: Not on file  . Years of education: Not on file  . Highest education level: Not on file  Occupational History  . Not on file  Tobacco Use  . Smoking status: Former Games developer  . Smokeless tobacco: Never Used  Vaping Use  . Vaping Use: Never used  Substance and  Sexual Activity  . Alcohol use: Yes    Alcohol/week: 0.0 standard drinks    Comment: OCC  . Drug use: No  . Sexual activity: Not on file  Other Topics Concern  . Not on file  Social History Narrative  . Not on file   Social Determinants of Health   Financial Resource Strain:   . Difficulty of Paying Living Expenses: Not on file  Food Insecurity:   . Worried About Programme researcher, broadcasting/film/video in the Last Year: Not on file  . Ran Out of Food in the Last Year: Not on file  Transportation Needs:   . Lack of Transportation (Medical): Not on file  . Lack of Transportation (Non-Medical): Not on file  Physical Activity:   . Days of Exercise per Week: Not on file  . Minutes of Exercise per Session: Not on file  Stress:   . Feeling of Stress : Not on file  Social Connections:   . Frequency of Communication with Friends and Family: Not on file  . Frequency of Social Gatherings with Friends and Family: Not on file  . Attends Religious Services: Not on file  . Active Member of Clubs or Organizations: Not on file  . Attends Banker Meetings: Not on file  . Marital Status: Not on file  Intimate Partner Violence:   . Fear of Current or Ex-Partner: Not on file  . Emotionally Abused: Not on file  . Physically Abused: Not on file  . Sexually Abused: Not on file      Objective:    BP 129/83   Pulse 72   Temp (!) 97.5 F (36.4 C) (Temporal)   Ht 5\' 10"  (1.778 m)   Wt 284 lb (128.8 kg)   SpO2 94%   BMI 40.75 kg/m   Wt Readings from Last 3 Encounters:  01/23/20 284 lb (128.8 kg)  01/05/20 284 lb 9.6 oz (129.1 kg)  01/19/19 283 lb (128.4 kg)    Physical Exam Vitals reviewed.  Constitutional:      General: He is not in acute distress.    Appearance: Normal appearance. He is morbidly obese. He is not ill-appearing, toxic-appearing or diaphoretic.  HENT:     Head: Normocephalic and atraumatic.     Right Ear: Tympanic membrane, ear canal and external ear normal. There is no  impacted cerumen.     Left Ear: Tympanic membrane, ear canal and external ear normal. There is no impacted cerumen.     Nose: Nose normal. No congestion or rhinorrhea.     Mouth/Throat:     Mouth: Mucous membranes are moist.     Pharynx: Oropharynx is clear. No oropharyngeal exudate or posterior oropharyngeal erythema.  Eyes:     General: No scleral icterus.       Right eye: No discharge.        Left eye: No discharge.     Conjunctiva/sclera: Conjunctivae normal.     Pupils: Pupils are equal, round, and reactive to light.  Cardiovascular:     Rate and Rhythm: Normal rate and regular rhythm.     Heart sounds:  Normal heart sounds. No murmur heard.  No friction rub. No gallop.   Pulmonary:     Effort: Pulmonary effort is normal. No respiratory distress.     Breath sounds: Normal breath sounds. No stridor. No wheezing, rhonchi or rales.  Abdominal:     General: Abdomen is flat. Bowel sounds are normal. There is no distension.     Palpations: Abdomen is soft. There is no mass.     Tenderness: There is no abdominal tenderness. There is no guarding or rebound.     Hernia: No hernia is present.  Musculoskeletal:        General: Normal range of motion.     Cervical back: Normal range of motion and neck supple. No rigidity. No muscular tenderness.     Right lower leg: Edema present.     Left lower leg: Edema present.  Lymphadenopathy:     Cervical: No cervical adenopathy.  Skin:    General: Skin is warm and dry.     Capillary Refill: Capillary refill takes less than 2 seconds.  Neurological:     General: No focal deficit present.     Mental Status: He is alert and oriented to person, place, and time. Mental status is at baseline.  Psychiatric:        Mood and Affect: Mood normal.        Behavior: Behavior normal.        Thought Content: Thought content normal.        Judgment: Judgment normal.    No results found for: TSH Lab Results  Component Value Date   WBC 7.5 01/19/2019    HGB 16.1 01/19/2019   HCT 49.2 01/19/2019   MCV 89 01/19/2019   PLT 247 01/19/2019   Lab Results  Component Value Date   NA 140 01/19/2019   K 4.6 01/19/2019   CO2 28 01/19/2019   GLUCOSE 127 (H) 01/19/2019   BUN 13 01/19/2019   CREATININE 1.03 01/19/2019   BILITOT 0.5 01/19/2019   ALKPHOS 97 01/19/2019   AST 25 01/19/2019   ALT 18 01/19/2019   PROT 6.8 01/19/2019   ALBUMIN 4.1 01/19/2019   CALCIUM 9.0 01/19/2019   Lab Results  Component Value Date   CHOL 124 01/19/2019   Lab Results  Component Value Date   HDL 42 01/19/2019   Lab Results  Component Value Date   LDLCALC 65 01/19/2019   Lab Results  Component Value Date   TRIG 86 01/19/2019   Lab Results  Component Value Date   CHOLHDL 3.0 01/19/2019   Lab Results  Component Value Date   HGBA1C 6.2 (H) 01/19/2019

## 2020-01-23 NOTE — Patient Instructions (Signed)
Preventive Care 65 Years and Older, Male Preventive care refers to lifestyle choices and visits with your health care provider that can promote health and wellness. This includes:  A yearly physical exam. This is also called an annual well check.  Regular dental and eye exams.  Immunizations.  Screening for certain conditions.  Healthy lifestyle choices, such as diet and exercise. What can I expect for my preventive care visit? Physical exam Your health care provider will check:  Height and weight. These may be used to calculate body mass index (BMI), which is a measurement that tells if you are at a healthy weight.  Heart rate and blood pressure.  Your skin for abnormal spots. Counseling Your health care provider may ask you questions about:  Alcohol, tobacco, and drug use.  Emotional well-being.  Home and relationship well-being.  Sexual activity.  Eating habits.  History of falls.  Memory and ability to understand (cognition).  Work and work environment. What immunizations do I need?  Influenza (flu) vaccine  This is recommended every year. Tetanus, diphtheria, and pertussis (Tdap) vaccine  You may need a Td booster every 10 years. Varicella (chickenpox) vaccine  You may need this vaccine if you have not already been vaccinated. Zoster (shingles) vaccine  You may need this after age 60. Pneumococcal conjugate (PCV13) vaccine  One dose is recommended after age 65. Pneumococcal polysaccharide (PPSV23) vaccine  One dose is recommended after age 65. Measles, mumps, and rubella (MMR) vaccine  You may need at least one dose of MMR if you were born in 1957 or later. You may also need a second dose. Meningococcal conjugate (MenACWY) vaccine  You may need this if you have certain conditions. Hepatitis A vaccine  You may need this if you have certain conditions or if you travel or work in places where you may be exposed to hepatitis A. Hepatitis B  vaccine  You may need this if you have certain conditions or if you travel or work in places where you may be exposed to hepatitis B. Haemophilus influenzae type b (Hib) vaccine  You may need this if you have certain conditions. You may receive vaccines as individual doses or as more than one vaccine together in one shot (combination vaccines). Talk with your health care provider about the risks and benefits of combination vaccines. What tests do I need? Blood tests  Lipid and cholesterol levels. These may be checked every 5 years, or more frequently depending on your overall health.  Hepatitis C test.  Hepatitis B test. Screening  Lung cancer screening. You may have this screening every year starting at age 55 if you have a 30-pack-year history of smoking and currently smoke or have quit within the past 15 years.  Colorectal cancer screening. All adults should have this screening starting at age 50 and continuing until age 75. Your health care provider may recommend screening at age 45 if you are at increased risk. You will have tests every 1-10 years, depending on your results and the type of screening test.  Prostate cancer screening. Recommendations will vary depending on your family history and other risks.  Diabetes screening. This is done by checking your blood sugar (glucose) after you have not eaten for a while (fasting). You may have this done every 1-3 years.  Abdominal aortic aneurysm (AAA) screening. You may need this if you are a current or former smoker.  Sexually transmitted disease (STD) testing. Follow these instructions at home: Eating and drinking  Eat   a diet that includes fresh fruits and vegetables, whole grains, lean protein, and low-fat dairy products. Limit your intake of foods with high amounts of sugar, saturated fats, and salt.  Take vitamin and mineral supplements as recommended by your health care provider.  Do not drink alcohol if your health care  provider tells you not to drink.  If you drink alcohol: ? Limit how much you have to 0-2 drinks a day. ? Be aware of how much alcohol is in your drink. In the [place] one drink equals one 12 oz bottle of beer (355 mL), one 5 oz glass of wine (148 mL), or one 1 oz glass of hard liquor (44 mL). Lifestyle  Take daily care of your teeth and gums.  Stay active. Exercise for at least 30 minutes on 5 or more days each week.  Do not use any products that contain nicotine or tobacco, such as cigarettes, e-cigarettes, and chewing tobacco. If you need help quitting, ask your health care provider.  If you are sexually active, practice safe sex. Use a condom or other form of protection to prevent STIs (sexually transmitted infections).  Talk with your health care provider about taking a low-dose aspirin or statin. What's next?  Visit your health care provider once a year for a well check visit.  Ask your health care provider how often you should have your eyes and teeth checked.  Stay up to date on all vaccines. This information is not intended to replace advice given to you by your health care provider. Make sure you discuss any questions you have with your health care provider. Document Revised: 04/07/2018 Document Reviewed: 04/07/2018 Elsevier Patient Education  2020 Elsevier Inc.  

## 2020-01-24 LAB — CMP14+EGFR
ALT: 12 IU/L (ref 0–44)
AST: 16 IU/L (ref 0–40)
Albumin/Globulin Ratio: 1.8 (ref 1.2–2.2)
Albumin: 4.3 g/dL (ref 3.7–4.7)
Alkaline Phosphatase: 85 IU/L (ref 44–121)
BUN/Creatinine Ratio: 18 (ref 10–24)
BUN: 18 mg/dL (ref 8–27)
Bilirubin Total: 0.4 mg/dL (ref 0.0–1.2)
CO2: 27 mmol/L (ref 20–29)
Calcium: 9.1 mg/dL (ref 8.6–10.2)
Chloride: 98 mmol/L (ref 96–106)
Creatinine, Ser: 1.02 mg/dL (ref 0.76–1.27)
GFR calc Af Amer: 85 mL/min/{1.73_m2} (ref >59)
GFR calc non Af Amer: 74 mL/min/{1.73_m2} (ref >59)
Globulin, Total: 2.4 g/dL (ref 1.5–4.5)
Glucose: 118 mg/dL — ABNORMAL HIGH (ref 65–99)
Potassium: 5 mmol/L (ref 3.5–5.2)
Sodium: 138 mmol/L (ref 134–144)
Total Protein: 6.7 g/dL (ref 6.0–8.5)

## 2020-01-24 LAB — TSH: TSH: 1.49 u[IU]/mL (ref 0.450–4.500)

## 2020-01-24 LAB — LIPID PANEL
Chol/HDL Ratio: 2.7 ratio (ref 0.0–5.0)
Cholesterol, Total: 114 mg/dL (ref 100–199)
HDL: 43 mg/dL (ref >39)
LDL Chol Calc (NIH): 56 mg/dL (ref 0–99)
Triglycerides: 70 mg/dL (ref 0–149)
VLDL Cholesterol Cal: 15 mg/dL (ref 5–40)

## 2020-01-24 LAB — CBC WITH DIFFERENTIAL/PLATELET
Basophils Absolute: 0.1 10*3/uL (ref 0.0–0.2)
Basos: 1 %
EOS (ABSOLUTE): 0.2 10*3/uL (ref 0.0–0.4)
Eos: 3 %
Hematocrit: 46.8 % (ref 37.5–51.0)
Hemoglobin: 15.9 g/dL (ref 13.0–17.7)
Immature Grans (Abs): 0 10*3/uL (ref 0.0–0.1)
Immature Granulocytes: 0 %
Lymphocytes Absolute: 1.9 10*3/uL (ref 0.7–3.1)
Lymphs: 25 %
MCH: 29.4 pg (ref 26.6–33.0)
MCHC: 34 g/dL (ref 31.5–35.7)
MCV: 87 fL (ref 79–97)
Monocytes Absolute: 0.7 10*3/uL (ref 0.1–0.9)
Monocytes: 9 %
Neutrophils Absolute: 4.8 10*3/uL (ref 1.4–7.0)
Neutrophils: 62 %
Platelets: 227 10*3/uL (ref 150–450)
RBC: 5.41 x10E6/uL (ref 4.14–5.80)
RDW: 12.2 % (ref 11.6–15.4)
WBC: 7.7 10*3/uL (ref 3.4–10.8)

## 2020-01-25 ENCOUNTER — Encounter: Payer: Self-pay | Admitting: Family Medicine

## 2020-01-26 ENCOUNTER — Other Ambulatory Visit: Payer: Self-pay | Admitting: Family Medicine

## 2020-01-26 MED ORDER — HYDROCHLOROTHIAZIDE 12.5 MG PO CAPS: 12.5000 mg | ORAL_CAPSULE | Freq: Every day | ORAL | 2 refills | Status: DC

## 2020-01-29 ENCOUNTER — Encounter: Payer: Self-pay | Admitting: Family Medicine

## 2020-04-18 ENCOUNTER — Other Ambulatory Visit: Payer: Self-pay | Admitting: Family Medicine

## 2020-04-18 NOTE — Telephone Encounter (Signed)
OV 01/23/20 RTC 1 yr

## 2020-06-29 DIAGNOSIS — R21 Rash and other nonspecific skin eruption: Secondary | ICD-10-CM | POA: Diagnosis not present

## 2020-07-06 ENCOUNTER — Other Ambulatory Visit: Payer: Self-pay | Admitting: Family Medicine

## 2020-07-06 DIAGNOSIS — R21 Rash and other nonspecific skin eruption: Secondary | ICD-10-CM

## 2020-07-08 ENCOUNTER — Other Ambulatory Visit: Payer: Self-pay | Admitting: Family Medicine

## 2020-07-08 DIAGNOSIS — J302 Other seasonal allergic rhinitis: Secondary | ICD-10-CM

## 2020-07-08 MED ORDER — TRIAMCINOLONE ACETONIDE 0.1 % EX CREA: 1.0000 "application " | TOPICAL_CREAM | Freq: Two times a day (BID) | CUTANEOUS | 0 refills | Status: DC

## 2020-08-16 ENCOUNTER — Other Ambulatory Visit: Payer: Self-pay | Admitting: Family Medicine

## 2020-08-16 DIAGNOSIS — R21 Rash and other nonspecific skin eruption: Secondary | ICD-10-CM

## 2020-10-18 ENCOUNTER — Ambulatory Visit (INDEPENDENT_AMBULATORY_CARE_PROVIDER_SITE_OTHER): Payer: Medicare Other | Admitting: Family Medicine

## 2020-10-18 ENCOUNTER — Other Ambulatory Visit: Payer: Self-pay

## 2020-10-18 ENCOUNTER — Encounter: Payer: Self-pay | Admitting: Family Medicine

## 2020-10-18 DIAGNOSIS — U071 COVID-19: Secondary | ICD-10-CM

## 2020-10-18 MED ORDER — DEXAMETHASONE 6 MG PO TABS: 6.0000 mg | ORAL_TABLET | Freq: Two times a day (BID) | ORAL | 0 refills | Status: DC

## 2020-10-18 MED ORDER — MOLNUPIRAVIR EUA 200MG CAPSULE: 4.0000 | ORAL_CAPSULE | Freq: Two times a day (BID) | ORAL | 0 refills | Status: AC

## 2020-10-18 NOTE — Progress Notes (Signed)
    Subjective:    Patient ID: Ronald Pitts, male    DOB: 1950/12/11, 70 y.o.   MRN: [id number]   HPI: Ronald Pitts is a 70 y.o. male presenting for positive covid test. Cough, LG fever, tired, Headache. No diarrhea, no loss of appetite. Onset yesterday afternoon.    Depression screen [facility] 2/9 01/23/2020 01/05/2020 01/19/2019  Decreased Interest 0 0 0  Down, Depressed, Hopeless 0 0 0  PHQ - 2 Score 0 0 0     Relevant past medical, surgical, family and social history reviewed and updated as indicated.  Interim medical history since our last visit reviewed. Allergies and medications reviewed and updated.  ROS:  Review of Systems  Constitutional:  Positive for fatigue and fever. Negative for appetite change.  Respiratory:  Negative for shortness of breath.   Cardiovascular:  Negative for chest pain.  Musculoskeletal:  Negative for arthralgias.  Skin:  Negative for rash.    Social History   Tobacco Use  Smoking Status Former   Pack years: 0.00  Smokeless Tobacco Never       Objective:     Wt Readings from Last 3 Encounters:  01/23/20 284 lb (128.8 kg)  01/05/20 284 lb 9.6 oz (129.1 kg)  01/19/19 283 lb (128.4 kg)     Exam deferred. Pt. Harboring due to COVID 19. Phone visit performed.   Assessment & Plan:   1. COVID-19 virus infection     Meds ordered this encounter  Medications   molnupiravir EUA 200 mg CAPS    Sig: Take 4 capsules (800 mg total) by mouth 2 (two) times daily for 5 days.    Dispense:  40 capsule    Refill:  0   dexamethasone (DECADRON) 6 MG tablet    Sig: Take 1 tablet (6 mg total) by mouth 2 (two) times daily with a meal.    Dispense:  10 tablet    Refill:  0     No orders of the defined types were placed in this encounter.     Diagnoses and all orders for this visit:  COVID-19 virus infection  Other orders -     molnupiravir EUA 200 mg CAPS; Take 4 capsules (800 mg total) by mouth 2 (two) times daily for 5 days. -      dexamethasone (DECADRON) 6 MG tablet; Take 1 tablet (6 mg total) by mouth 2 (two) times daily with a meal.   Virtual Visit via telephone Note  I discussed the limitations, risks, security and privacy concerns of performing an evaluation and management service by telephone and the availability of in person appointments. The patient was identified with two identifiers. Pt.expressed understanding and agreed to proceed. Pt. Is at home. Dr. Livia Snellen is in his office.  Follow Up Instructions:   I discussed the assessment and treatment plan with the patient. The patient was provided an opportunity to ask questions and all were answered. The patient agreed with the plan and demonstrated an understanding of the instructions.   The patient was advised to call back or seek an in-person evaluation if the symptoms worsen or if the condition fails to improve as anticipated.   Total minutes including chart review and phone contact time: 6   Follow up plan: Return if symptoms worsen or fail to improve.  Claretta Fraise, MD [place]

## 2020-11-04 ENCOUNTER — Ambulatory Visit (INDEPENDENT_AMBULATORY_CARE_PROVIDER_SITE_OTHER): Payer: Medicare Other | Admitting: Family Medicine

## 2020-11-04 ENCOUNTER — Encounter: Payer: Self-pay | Admitting: Family Medicine

## 2020-11-04 DIAGNOSIS — J019 Acute sinusitis, unspecified: Secondary | ICD-10-CM

## 2020-11-04 DIAGNOSIS — B9689 Other specified bacterial agents as the cause of diseases classified elsewhere: Secondary | ICD-10-CM | POA: Diagnosis not present

## 2020-11-04 MED ORDER — AMOXICILLIN 875 MG PO TABS: 875.0000 mg | ORAL_TABLET | Freq: Two times a day (BID) | ORAL | 0 refills | Status: AC

## 2020-11-04 MED ORDER — GUAIFENESIN ER 600 MG PO TB12: 600.0000 mg | ORAL_TABLET | Freq: Two times a day (BID) | ORAL | 0 refills | Status: DC | PRN

## 2020-11-04 NOTE — Progress Notes (Signed)
   Virtual Visit  Note Due to COVID-19 pandemic this visit was conducted virtually. This visit type was conducted due to national recommendations for restrictions regarding the COVID-19 Pandemic (e.g. social distancing, sheltering in place) in an effort to limit this patient's exposure and mitigate transmission in our community. All issues noted in this document were discussed and addressed.  A physical exam was not performed with this format.  I connected with Marrian Salvage on 11/04/20 at 1246 by telephone and verified that I am speaking with the correct person using two identifiers. Kwan Shellhammer is currently located at home and his wife is currently with him during the visit. The provider, Gwenlyn Perking, FNP is located in their office at time of visit.  I discussed the limitations, risks, security and privacy concerns of performing an evaluation and management service by telephone and the availability of in person appointments. I also discussed with the patient that there may be a patient responsible charge related to this service. The patient expressed understanding and agreed to proceed.  CC: congestion  History and Present Illness:  HPI Roshard tested positive for Covid on 10/18/20. He was treated with molnupiravir and steroids. He felt better better follow treatment, however he continues to have head congestion, sinus drainage, and a cough. He has been taking advil for his symptoms. Denies fever, chills, headache, chest pain, or shortness of breath.     ROS As per HPI.   Observations/Objective: Alert and oriented x 3. Able to speak in full sentences without difficulty.    Assessment and Plan: Demetrice was seen today for nasal congestion.  Diagnoses and all orders for this visit:  Acute bacterial sinusitis Will treat for possible secondary bacterial infection. Mucinex for cough and congestion. Rest, hydration.  -     amoxicillin (AMOXIL) 875 MG tablet; Take 1 tablet (875 mg total)  by mouth 2 (two) times daily for 10 days. -     guaiFENesin (MUCINEX) 600 MG 12 hr tablet; Take 1-2 tablets (600-1,200 mg total) by mouth 2 (two) times daily as needed for cough or to loosen phlegm.    Follow Up Instructions: Return to office for new or worsening symptoms, or if symptoms persist.     I discussed the assessment and treatment plan with the patient. The patient was provided an opportunity to ask questions and all were answered. The patient agreed with the plan and demonstrated an understanding of the instructions.   The patient was advised to call back or seek an in-person evaluation if the symptoms worsen or if the condition fails to improve as anticipated.  The above assessment and management plan was discussed with the patient. The patient verbalized understanding of and has agreed to the management plan. Patient is aware to call the clinic if symptoms persist or worsen. Patient is aware when to return to the clinic for a follow-up visit. Patient educated on when it is appropriate to go to the emergency department.   Time call ended:  1257  I provided 11 minutes of  non face-to-face time during this encounter.    Gwenlyn Perking, FNP

## 2020-11-07 ENCOUNTER — Ambulatory Visit (INDEPENDENT_AMBULATORY_CARE_PROVIDER_SITE_OTHER): Payer: Medicare Other

## 2020-11-07 VITALS — Ht 70.0 in | Wt 280.0 lb

## 2020-11-07 DIAGNOSIS — Z0000 Encounter for general adult medical examination without abnormal findings: Secondary | ICD-10-CM

## 2020-11-07 NOTE — Progress Notes (Signed)
Subjective:   Kristjan Derner is a 70 y.o. male who presents for an Initial Medicare Annual Wellness Visit.  Virtual Visit via Telephone Note  I connected with  Marrian Salvage on 11/07/20 at 10:30 AM EDT by telephone and verified that I am speaking with the correct person using two identifiers.  Location: Patient: Home Provider: [facility] Persons participating in the virtual visit: patient/Nurse Health Advisor   I discussed the limitations, risks, security and privacy concerns of performing an evaluation and management service by telephone and the availability of in person appointments. The patient expressed understanding and agreed to proceed.  Interactive audio and video telecommunications were attempted between this nurse and patient, however failed, due to patient having technical difficulties OR patient did not have access to video capability.  We continued and completed visit with audio only.  Some vital signs may be absent or patient reported.   Aashir Umholtz E [place] Prosch, LPN   Review of Systems     Cardiac Risk Factors include: advanced age (>58men, >81 women);obesity (BMI >30kg/m2);sedentary lifestyle;male gender     Objective:    Today's Vitals   11/07/20 1036  Weight: 280 lb (127 kg)  Height: 5\' 10"  (1.778 m)   Body mass index is 40.18 kg/m.  Advanced Directives 11/07/2020 01/02/2016 11/02/2015 04/24/2015  Does Patient Have a Medical Advance Directive? Yes Yes Yes Yes  Type of Paramedic of [place];Living will [place];Living will Living will;Healthcare Power of Attorney Living will;Healthcare Power of Attorney  Does patient want to make changes to medical advance directive? - - - No - Patient declined  Copy of [place] in Chart? No - copy requested - - No - copy requested    Current Medications (verified) Outpatient Encounter Medications as of 11/07/2020  Medication Sig   amLODipine (NORVASC) 5 MG tablet Take 1 tablet  (5 mg total) by mouth daily.   amoxicillin (AMOXIL) 875 MG tablet Take 1 tablet (875 mg total) by mouth 2 (two) times daily for 10 days.   aspirin 81 MG tablet Take by mouth daily.   fluticasone (FLONASE) 50 MCG/ACT nasal spray SPRAY 2 SPRAYS INTO EACH NOSTRIL EVERY DAY   guaiFENesin (MUCINEX) 600 MG 12 hr tablet Take 1-2 tablets (600-1,200 mg total) by mouth 2 (two) times daily as needed for cough or to loosen phlegm.   hydrochlorothiazide (MICROZIDE) 12.5 MG capsule TAKE 1 CAPSULE BY MOUTH EVERY DAY   hydrOXYzine (ATARAX/VISTARIL) 25 MG tablet Take 50 mg by mouth every 6 (six) hours as needed.   triamcinolone cream (KENALOG) 0.1 % APPLY TO AFFECTED AREA TWICE A DAY   dexamethasone (DECADRON) 6 MG tablet Take 1 tablet (6 mg total) by mouth 2 (two) times daily with a meal. (Patient not taking: Reported on 11/07/2020)   meloxicam (MOBIC) 15 MG tablet TAKE ONE TABLET (15 MG TOTAL) BY MOUTH DAILY. (Patient not taking: Reported on 11/07/2020)   No facility-administered encounter medications on file as of 11/07/2020.    Allergies (verified) Patient has no known allergies.   History: Past Medical History:  Diagnosis Date   Hypertension    Personal history of colonic polyps 04/03/2010   Prediabetes    Past Surgical History:  Procedure Laterality Date   COLONOSCOPY     index finger     right   Family History  Problem Relation Age of Onset   Colon cancer Neg Hx    Social History   Socioeconomic History   Marital status: Married  Spouse name: Enid Derry   Number of children: 2   Years of education: Not on file   Highest education level: Not on file  Occupational History   Occupation: retired  Tobacco Use   Smoking status: Former   Smokeless tobacco: Never  Scientific laboratory technician Use: Never used  Substance and Sexual Activity   Alcohol use: Yes    Alcohol/week: 0.0 standard drinks    Comment: OCC   Drug use: No   Sexual activity: Not on file  Other Topics Concern   Not on file   Social History Narrative   One level living with wife, Enid Derry - son lives in [place], daughter in [place]   Social Determinants of Health   Financial Resource Strain: Low Risk    Difficulty of Paying Living Expenses: Not hard at all  Food Insecurity: No Food Insecurity   Worried About Charity fundraiser in the Last Year: Never true   Arboriculturist in the Last Year: Never true  Transportation Needs: No Transportation Needs   Lack of Transportation (Medical): No   Lack of Transportation (Non-Medical): No  Physical Activity: Insufficiently Active   Days of Exercise per Week: 7 days   Minutes of Exercise per Session: 10 min  Stress: No Stress Concern Present   Feeling of Stress : Not at all  Social Connections: Moderately Isolated   Frequency of Communication with Friends and Family: More than three times a week   Frequency of Social Gatherings with Friends and Family: More than three times a week   Attends Religious Services: Never   Marine scientist or Organizations: No   Attends Music therapist: Never   Marital Status: Married    Tobacco Counseling Counseling given: Not Answered   Clinical Intake:  Pre-visit preparation completed: Yes  Pain : No/denies pain     BMI - recorded: 40.18 Nutritional Status: BMI > 30  Obese Nutritional Risks: None Diabetes: No  How often do you need to have someone help you when you read instructions, pamphlets, or other written materials from your doctor or pharmacy?: 1 - Never  Diabetic? No  Interpreter Needed?: No  Information entered by :: Chancy Smigiel, LPN   Activities of Daily Living In your present state of health, do you have any difficulty performing the following activities: 11/07/2020  Hearing? N  Vision? N  Difficulty concentrating or making decisions? N  Walking or climbing stairs? N  Dressing or bathing? N  Doing errands, shopping? N  Preparing Food and eating ? N  Using the Toilet? N  In  the past six months, have you accidently leaked urine? N  Do you have problems with loss of bowel control? N  Managing your Medications? N  Managing your Finances? N  Housekeeping or managing your Housekeeping? N  Some recent data might be hidden    Patient Care Team: Loman Brooklyn, FNP as PCP - General (Family Medicine)  Indicate any recent Medical Services you may have received from other than [facility] providers in the past year (date may be approximate).     Assessment:   This is a routine wellness examination for [employer].  Hearing/Vision screen Hearing Screening - Comments:: Denies hearing difficulties  Vision Screening - Comments:: Wears eyeglasses - up to date with semi-annual eye exam at [facility] issues and exercise activities discussed: Current Exercise Habits: The patient does not participate in regular exercise at present, Exercise limited by: None  identified   Goals Addressed             This Visit's Progress    Patient Stated       Wants to stay healthy       Depression Screen PHQ 2/9 Scores 11/07/2020 01/23/2020 01/05/2020 01/19/2019  PHQ - 2 Score 0 0 0 0    Fall Risk Fall Risk  11/07/2020 01/23/2020 01/05/2020 01/19/2019  Falls in the past year? 0 0 0 0  Number falls in past yr: 0 - - -  Injury with Fall? 0 - - -  Risk for fall due to : Impaired vision - - -  Follow up Falls prevention discussed - - -    FALL RISK PREVENTION PERTAINING TO THE HOME:  Any stairs in or around the home? No  If so, are there any without handrails? No  Home free of loose throw rugs in walkways, pet beds, electrical cords, etc? Yes  Adequate lighting in your home to reduce risk of falls? Yes   ASSISTIVE DEVICES UTILIZED TO PREVENT FALLS:  Life alert? No  Use of a cane, walker or w/c? No  Grab bars in the bathroom? Yes  Shower chair or bench in shower? Yes  Elevated toilet seat or a handicapped toilet? Yes   TIMED UP AND GO:  Was the test performed? No .  Telephonic visit  Cognitive Function: Normal cognitive status assessed by direct observation by this Nurse Health Advisor. No abnormalities found.      6CIT Screen 11/07/2020  What Year? 0 points  What month? 0 points  What time? 0 points  Count back from 20 0 points  Months in reverse 0 points  Repeat phrase 0 points  Total Score 0    Immunizations Immunization History  Administered Date(s) Administered   Fluad Quad(high Dose 65+) 01/19/2019, 01/23/2020   Influenza Split 04/27/2012, 01/30/2020   Influenza, High Dose Seasonal PF 03/08/2015, 02/19/2016, 01/27/2017, 03/07/2018   Influenza,trivalent, recombinat, inj, PF 03/06/2014   Influenza-Unspecified 03/06/2014   PFIZER SARS-COV-2 Pediatric Vaccination 5-69yrs 01/30/2020   PFIZER(Purple Top)SARS-COV-2 Vaccination 05/17/2019, 06/07/2019   Pneumococcal Conjugate-13 03/08/2015   Pneumococcal Polysaccharide-23 04/27/2009, 02/19/2016   Tdap 01/03/2016    TDAP status: Up to date  Flu Vaccine status: Up to date  Pneumococcal vaccine status: Up to date  Covid-19 vaccine status: Completed vaccines  Qualifies for Shingles Vaccine? Yes   Zostavax completed Yes   Shingrix Completed?: No.    Education has been provided regarding the importance of this vaccine. Patient has been advised to call insurance company to determine out of pocket expense if they have not yet received this vaccine. Advised may also receive vaccine at local pharmacy or Health Dept. Verbalized acceptance and understanding.  Screening Tests Health Maintenance  Topic Date Due   Zoster Vaccines- Shingrix (1 of 2) Never done   COLONOSCOPY (Pts 45-14yrs Insurance coverage will need to be confirmed)  05/07/2020   COVID-19 Vaccine (3 - Booster for Pfizer series) 06/29/2020   INFLUENZA VACCINE  11/25/2020   TETANUS/TDAP  01/02/2026   Hepatitis C Screening  Completed   PNA vac Low Risk Adult  Completed   HPV VACCINES  Aged Out    Health Maintenance  Health  Maintenance Due  Topic Date Due   Zoster Vaccines- Shingrix (1 of 2) Never done   COLONOSCOPY (Pts 45-47yrs Insurance coverage will need to be confirmed)  05/07/2020   COVID-19 Vaccine (3 - Booster for [place] series) 06/29/2020    Colorectal  cancer screening: Type of screening: Colonoscopy. Completed 05/08/2015. Repeat every 5 years Has routine appts with  GI - will make appt soon  Lung Cancer Screening: (Low Dose CT Chest recommended if Age 1-80 years, 30 pack-year currently smoking OR have quit w/in 15years.) does not qualify.   Additional Screening:  Hepatitis C Screening: does qualify; Completed 01/19/2019  Vision Screening: Recommended annual ophthalmology exams for early detection of glaucoma and other disorders of the eye. Is the patient up to date with their annual eye exam?  Yes  Who is the provider or what is the name of the office in which the patient attends annual eye exams? [facility] If pt is not established with a provider, would they like to be referred to a provider to establish care? No .   Dental Screening: Recommended annual dental exams for proper oral hygiene  Community Resource Referral / Chronic Care Management: CRR required this visit?  No   CCM required this visit?  No      Plan:     I have personally reviewed and noted the following in the patient's chart:   Medical and social history Use of alcohol, tobacco or illicit drugs  Current medications and supplements including opioid prescriptions. Patient is not currently taking opioid prescriptions. Functional ability and status Nutritional status Physical activity Advanced directives List of other physicians Hospitalizations, surgeries, and ER visits in previous 12 months Vitals Screenings to include cognitive, depression, and falls Referrals and appointments  In addition, I have reviewed and discussed with patient certain preventive protocols, quality metrics, and best practice  recommendations. A written personalized care plan for preventive services as well as general preventive health recommendations were provided to patient.     Sandrea Hammond, LPN   5/50/1586   Nurse Notes: None

## 2020-11-07 NOTE — Patient Instructions (Addendum)
Mr. Ronald Pitts , Thank you for taking time to come for your Medicare Wellness Visit. I appreciate your ongoing commitment to your health goals. Please review the following plan we discussed and let me know if I can assist you in the future.   Screening recommendations/referrals: Colonoscopy: Done 05/08/2015 - Repeat in 5 years DUE Recommended yearly ophthalmology/optometry visit for glaucoma screening and checkup Recommended yearly dental visit for hygiene and checkup  Vaccinations: Influenza vaccine: Done 01/23/2020 - Repeat annually Pneumococcal vaccine: Done 03/08/2015 & 02/19/2016 Tdap vaccine: Done 01/03/2016 - Repeat in 10 years  Shingles vaccine: Zostavax done; due for Shingrix. discussed. Please contact your pharmacy for coverage information.     Covid-19: Done 05/17/19, 06/07/19, & 01/30/2020  Advanced directives: Please bring a copy of your health care power of attorney and living will to the office to be added to your chart at your convenience.   Conditions/risks identified: Aim for 30 minutes of exercise or brisk walking each day, drink 6-8 glasses of water and eat lots of fruits and vegetables.   Next appointment: Follow up in one year for your annual wellness visit.   Preventive Care 72 Years and Older, Male  Preventive care refers to lifestyle choices and visits with your health care provider that can promote health and wellness. What does preventive care include? A yearly physical exam. This is also called an annual well check. Dental exams once or twice a year. Routine eye exams. Ask your health care provider how often you should have your eyes checked. Personal lifestyle choices, including: Daily care of your teeth and gums. Regular physical activity. Eating a healthy diet. Avoiding tobacco and drug use. Limiting alcohol use. Practicing safe sex. Taking low doses of aspirin every day. Taking vitamin and mineral supplements as recommended by your health care provider. What  happens during an annual well check? The services and screenings done by your health care provider during your annual well check will depend on your age, overall health, lifestyle risk factors, and family history of disease. Counseling  Your health care provider may ask you questions about your: Alcohol use. Tobacco use. Drug use. Emotional well-being. Home and relationship well-being. Sexual activity. Eating habits. History of falls. Memory and ability to understand (cognition). Work and work Statistician. Screening  You may have the following tests or measurements: Height, weight, and BMI. Blood pressure. Lipid and cholesterol levels. These may be checked every 5 years, or more frequently if you are over 58 years old. Skin check. Lung cancer screening. You may have this screening every year starting at age 19 if you have a 30-pack-year history of smoking and currently smoke or have quit within the past 15 years. Fecal occult blood test (FOBT) of the stool. You may have this test every year starting at age 50. Flexible sigmoidoscopy or colonoscopy. You may have a sigmoidoscopy every 5 years or a colonoscopy every 10 years starting at age 65. Prostate cancer screening. Recommendations will vary depending on your family history and other risks. Hepatitis C blood test. Hepatitis B blood test. Sexually transmitted disease (STD) testing. Diabetes screening. This is done by checking your blood sugar (glucose) after you have not eaten for a while (fasting). You may have this done every 1-3 years. Abdominal aortic aneurysm (AAA) screening. You may need this if you are a current or former smoker. Osteoporosis. You may be screened starting at age 63 if you are at high risk. Talk with your health care provider about your test results, treatment  options, and if necessary, the need for more tests. Vaccines  Your health care provider may recommend certain vaccines, such as: Influenza vaccine. This  is recommended every year. Tetanus, diphtheria, and acellular pertussis (Tdap, Td) vaccine. You may need a Td booster every 10 years. Zoster vaccine. You may need this after age 48. Pneumococcal 13-valent conjugate (PCV13) vaccine. One dose is recommended after age 3. Pneumococcal polysaccharide (PPSV23) vaccine. One dose is recommended after age 39. Talk to your health care provider about which screenings and vaccines you need and how often you need them. This information is not intended to replace advice given to you by your health care provider. Make sure you discuss any questions you have with your health care provider. Document Released: 05/10/2015 Document Revised: 01/01/2016 Document Reviewed: 02/12/2015 Elsevier Interactive Patient Education  2017 [employer] Prevention in the Home Falls can cause injuries. They can happen to people of all ages. There are many things you can do to make your home safe and to help prevent falls. What can I do on the outside of my home? Regularly fix the edges of walkways and driveways and fix any cracks. Remove anything that might make you trip as you walk through a door, such as a raised step or threshold. Trim any bushes or trees on the path to your home. Use bright outdoor lighting. Clear any walking paths of anything that might make someone trip, such as rocks or tools. Regularly check to see if handrails are loose or broken. Make sure that both sides of any steps have handrails. Any raised decks and porches should have guardrails on the edges. Have any leaves, snow, or ice cleared regularly. Use sand or salt on walking paths during winter. Clean up any spills in your garage right away. This includes oil or grease spills. What can I do in the bathroom? Use night lights. Install grab bars by the toilet and in the tub and shower. Do not use towel bars as grab bars. Use non-skid mats or decals in the tub or shower. If you need to sit down  in the shower, use a plastic, non-slip stool. Keep the floor dry. Clean up any water that spills on the floor as soon as it happens. Remove soap buildup in the tub or shower regularly. Attach bath mats securely with double-sided non-slip rug tape. Do not have throw rugs and other things on the floor that can make you trip. What can I do in the bedroom? Use night lights. Make sure that you have a light by your bed that is easy to reach. Do not use any sheets or blankets that are too big for your bed. They should not hang down onto the floor. Have a firm chair that has side arms. You can use this for support while you get dressed. Do not have throw rugs and other things on the floor that can make you trip. What can I do in the kitchen? Clean up any spills right away. Avoid walking on wet floors. Keep items that you use a lot in easy-to-reach places. If you need to reach something above you, use a strong step stool that has a grab bar. Keep electrical cords out of the way. Do not use floor polish or wax that makes floors slippery. If you must use wax, use non-skid floor wax. Do not have throw rugs and other things on the floor that can make you trip. What can I do with my stairs? Do not  leave any items on the stairs. Make sure that there are handrails on both sides of the stairs and use them. Fix handrails that are broken or loose. Make sure that handrails are as long as the stairways. Check any carpeting to make sure that it is firmly attached to the stairs. Fix any carpet that is loose or worn. Avoid having throw rugs at the top or bottom of the stairs. If you do have throw rugs, attach them to the floor with carpet tape. Make sure that you have a light switch at the top of the stairs and the bottom of the stairs. If you do not have them, ask someone to add them for you. What else can I do to help prevent falls? Wear shoes that: Do not have high heels. Have rubber bottoms. Are comfortable  and fit you well. Are closed at the toe. Do not wear sandals. If you use a stepladder: Make sure that it is fully opened. Do not climb a closed stepladder. Make sure that both sides of the stepladder are locked into place. Ask someone to hold it for you, if possible. Clearly mark and make sure that you can see: Any grab bars or handrails. First and last steps. Where the edge of each step is. Use tools that help you move around (mobility aids) if they are needed. These include: Canes. Walkers. Scooters. Crutches. Turn on the lights when you go into a dark area. Replace any light bulbs as soon as they burn out. Set up your furniture so you have a clear path. Avoid moving your furniture around. If any of your floors are uneven, fix them. If there are any pets around you, be aware of where they are. Review your medicines with your doctor. Some medicines can make you feel dizzy. This can increase your chance of falling. Ask your doctor what other things that you can do to help prevent falls. This information is not intended to replace advice given to you by your health care provider. Make sure you discuss any questions you have with your health care provider. Document Released: 02/07/2009 Document Revised: 09/19/2015 Document Reviewed: 05/18/2014 Elsevier Interactive Patient Education  2017 Else[employer] Inc[employer]   Obesity, Adult Obesity is having too much body fat. Being obese means that your weight is morethan what is healthy for you. BMI is a number that explains how much body fat you have. If you have a BMI of 30 or more, you are obese. Obesity is often caused by eating or drinking morecalories than your body uses. Changing your lifestyle can help you lose weight. Obesity can cause serious health problems, such as: Stroke. Coronary artery disease (CAD). Type 2 diabetes. Some types of cancer, including cancers of the colon, breast, uterus, and gallbladder. Osteoarthritis. High blood pressure  (hypertension). High cholesterol. Sleep apnea. Gallbladder stones. Infertility problems. What are the causes? Eating meals each day that are high in calories, sugar, and fat. Being born with genes that may make you more likely to become obese. Having a medical condition that causes obesity. Taking certain medicines. Sitting a lot (having a sedentary lifestyle). Not getting enough sleep. Drinking a lot of drinks that have sugar in them. What increases the risk? Having a family history of obesity. Being an [place] American woman. Being a Hispanic man. Living in an area with limited access to: [place], recreation centers, or sidewalks. Healthy food choices, such as grocery stores and farmers' markets. What are the signs or symptoms? The main sign is  having too much body fat. How is this treated? Treatment for this condition often includes changing your lifestyle. Treatment may include: Changing your diet. This may include making a healthy meal plan. Exercise. This may include activity that causes your heart to beat faster (aerobic exercise) and strength training. Work with your doctor to design a program that works for you. Medicine to help you lose weight. This may be used if you are not able to lose 1 pound a week after 6 weeks of healthy eating and more exercise. Treating conditions that cause the obesity. Surgery. Options may include gastric banding and gastric bypass. This may be done if: Other treatments have not helped to improve your condition. You have a BMI of 40 or higher. You have life-threatening health problems related to obesity. Follow these instructions at home: Eating and drinking  Follow advice from your doctor about what to eat and drink. Your doctor may tell you to: Limit fast food, sweets, and processed snack foods. Choose low-fat options. For example, choose low-fat milk instead of whole milk. Eat 5 or more servings of fruits or vegetables each day. Eat at  home more often. This gives you more control over what you eat. Choose healthy foods when you eat out. Learn to read food labels. This will help you learn how much food is in 1 serving. Keep low-fat snacks available. Avoid drinks that have a lot of sugar in them. These include soda, fruit juice, iced tea with sugar, and flavored milk. Drink enough water to keep your pee (urine) pale yellow. Do not go on fad diets.  Physical activity Exercise often, as told by your doctor. Most adults should get up to 150 minutes of moderate-intensity exercise every week.Ask your doctor: What types of exercise are safe for you. How often you should exercise. Warm up and stretch before being active. Do slow stretching after being active (cool down). Rest between times of being active. Lifestyle Work with your doctor and a food expert (dietitian) to set a weight-loss goal that is best for you. Limit your screen time. Find ways to reward yourself that do not involve food. Do not drink alcohol if: Your doctor tells you not to drink. You are pregnant, may be pregnant, or are planning to become pregnant. If you drink alcohol: Limit how much you use to: 0-1 drink a day for women. 0-2 drinks a day for men. Be aware of how much alcohol is in your drink. In the [place] one drink equals one 12 oz bottle of beer (355 mL), one 5 oz glass of wine (148 mL), or one 1 oz glass of hard liquor (44 mL). General instructions Keep a weight-loss journal. This can help you keep track of: The food that you eat. How much exercise you get. Take over-the-counter and prescription medicines only as told by your doctor. Take vitamins and supplements only as told by your doctor. Think about joining a support group. Keep all follow-up visits as told by your doctor. This is important. Contact a doctor if: You cannot meet your weight loss goal after you have changed your diet and lifestyle for 6 weeks. Get help right away if  you: Are having trouble breathing. Are having thoughts of harming yourself. Summary Obesity is having too much body fat. Being obese means that your weight is more than what is healthy for you. Work with your doctor to set a weight-loss goal. Get regular exercise as told by your doctor. This information is not intended  to replace advice given to you by your health care provider. Make sure you discuss any questions you have with your healthcare provider. Document Revised: 12/16/2017 Document Reviewed: 12/16/2017 Elsevier Patient Education  2022 [employer].

## 2020-11-27 ENCOUNTER — Encounter: Payer: Self-pay | Admitting: Internal Medicine

## 2021-01-15 ENCOUNTER — Other Ambulatory Visit: Payer: Self-pay | Admitting: Family Medicine

## 2021-01-15 DIAGNOSIS — I10 Essential (primary) hypertension: Secondary | ICD-10-CM

## 2021-01-17 ENCOUNTER — Other Ambulatory Visit: Payer: Self-pay | Admitting: Family

## 2021-01-23 ENCOUNTER — Encounter: Payer: Medicare Other | Admitting: Family Medicine

## 2021-02-03 ENCOUNTER — Other Ambulatory Visit: Payer: Self-pay

## 2021-02-03 ENCOUNTER — Other Ambulatory Visit: Payer: Medicare Other

## 2021-02-03 DIAGNOSIS — I10 Essential (primary) hypertension: Secondary | ICD-10-CM

## 2021-02-03 DIAGNOSIS — R7303 Prediabetes: Secondary | ICD-10-CM | POA: Diagnosis not present

## 2021-02-03 LAB — BAYER DCA HB A1C WAIVED: HB A1C (BAYER DCA - WAIVED): 5.9 % — ABNORMAL HIGH (ref 4.8–5.6)

## 2021-02-04 LAB — LIPID PANEL
Chol/HDL Ratio: 3.4 ratio (ref 0.0–5.0)
Cholesterol, Total: 119 mg/dL (ref 100–199)
HDL: 35 mg/dL — ABNORMAL LOW (ref >39)
LDL Chol Calc (NIH): 64 mg/dL (ref 0–99)
Triglycerides: 106 mg/dL (ref 0–149)
VLDL Cholesterol Cal: 20 mg/dL (ref 5–40)

## 2021-02-04 LAB — CMP14+EGFR
ALT: 15 IU/L (ref 0–44)
AST: 21 IU/L (ref 0–40)
Albumin/Globulin Ratio: 1.7 (ref 1.2–2.2)
Albumin: 4.3 g/dL (ref 3.7–4.7)
Alkaline Phosphatase: 84 IU/L (ref 44–121)
BUN/Creatinine Ratio: 22 (ref 10–24)
BUN: 21 mg/dL (ref 8–27)
Bilirubin Total: 0.5 mg/dL (ref 0.0–1.2)
CO2: 25 mmol/L (ref 20–29)
Calcium: 9 mg/dL (ref 8.6–10.2)
Chloride: 96 mmol/L (ref 96–106)
Creatinine, Ser: 0.96 mg/dL (ref 0.76–1.27)
Globulin, Total: 2.5 g/dL (ref 1.5–4.5)
Glucose: 129 mg/dL — ABNORMAL HIGH (ref 70–99)
Potassium: 4.7 mmol/L (ref 3.5–5.2)
Sodium: 138 mmol/L (ref 134–144)
Total Protein: 6.8 g/dL (ref 6.0–8.5)
eGFR: 84 mL/min/{1.73_m2} (ref >59)

## 2021-02-04 LAB — CBC WITH DIFFERENTIAL/PLATELET
Basophils Absolute: 0.1 10*3/uL (ref 0.0–0.2)
Basos: 1 %
EOS (ABSOLUTE): 0.2 10*3/uL (ref 0.0–0.4)
Eos: 2 %
Hematocrit: 47.1 % (ref 37.5–51.0)
Hemoglobin: 16.1 g/dL (ref 13.0–17.7)
Immature Grans (Abs): 0 10*3/uL (ref 0.0–0.1)
Immature Granulocytes: 1 %
Lymphocytes Absolute: 2.5 10*3/uL (ref 0.7–3.1)
Lymphs: 28 %
MCH: 29.7 pg (ref 26.6–33.0)
MCHC: 34.2 g/dL (ref 31.5–35.7)
MCV: 87 fL (ref 79–97)
Monocytes Absolute: 0.7 10*3/uL (ref 0.1–0.9)
Monocytes: 8 %
Neutrophils Absolute: 5.4 10*3/uL (ref 1.4–7.0)
Neutrophils: 60 %
Platelets: 276 10*3/uL (ref 150–450)
RBC: 5.43 x10E6/uL (ref 4.14–5.80)
RDW: 11.8 % (ref 11.6–15.4)
WBC: 8.8 10*3/uL (ref 3.4–10.8)

## 2021-02-07 ENCOUNTER — Other Ambulatory Visit: Payer: Self-pay

## 2021-02-07 ENCOUNTER — Encounter: Payer: Self-pay | Admitting: Family Medicine

## 2021-02-07 ENCOUNTER — Ambulatory Visit (INDEPENDENT_AMBULATORY_CARE_PROVIDER_SITE_OTHER): Payer: Medicare Other | Admitting: Family Medicine

## 2021-02-07 VITALS — BP 130/85 | HR 81 | Temp 97.9°F | Ht 70.0 in | Wt 289.8 lb

## 2021-02-07 DIAGNOSIS — R7303 Prediabetes: Secondary | ICD-10-CM | POA: Diagnosis not present

## 2021-02-07 DIAGNOSIS — G8929 Other chronic pain: Secondary | ICD-10-CM | POA: Insufficient documentation

## 2021-02-07 DIAGNOSIS — Z0000 Encounter for general adult medical examination without abnormal findings: Secondary | ICD-10-CM

## 2021-02-07 DIAGNOSIS — Z86010 Personal history of colonic polyps: Secondary | ICD-10-CM | POA: Diagnosis not present

## 2021-02-07 DIAGNOSIS — Z0001 Encounter for general adult medical examination with abnormal findings: Secondary | ICD-10-CM | POA: Diagnosis not present

## 2021-02-07 DIAGNOSIS — M25562 Pain in left knee: Secondary | ICD-10-CM | POA: Diagnosis not present

## 2021-02-07 DIAGNOSIS — M25561 Pain in right knee: Secondary | ICD-10-CM | POA: Diagnosis not present

## 2021-02-07 DIAGNOSIS — Z23 Encounter for immunization: Secondary | ICD-10-CM

## 2021-02-07 DIAGNOSIS — Z6841 Body Mass Index (BMI) 40.0 and over, adult: Secondary | ICD-10-CM

## 2021-02-07 DIAGNOSIS — I10 Essential (primary) hypertension: Secondary | ICD-10-CM | POA: Diagnosis not present

## 2021-02-07 MED ORDER — HYDROCHLOROTHIAZIDE 12.5 MG PO CAPS: 12.5000 mg | ORAL_CAPSULE | Freq: Every day | ORAL | 3 refills | Status: DC

## 2021-02-07 MED ORDER — AMLODIPINE BESYLATE 5 MG PO TABS: 5.0000 mg | ORAL_TABLET | Freq: Every day | ORAL | 3 refills | Status: DC

## 2021-02-07 MED ORDER — OZEMPIC (0.25 OR 0.5 MG/DOSE) 2 MG/1.5ML ~~LOC~~ SOPN: 0.2500 mg | PEN_INJECTOR | SUBCUTANEOUS | 0 refills | Status: DC

## 2021-02-07 NOTE — Progress Notes (Signed)
Assessment & Plan:  1. Well adult exam Preventive health education provided.   2. Essential (primary) hypertension Well controlled on current regimen.  - hydrochlorothiazide (MICROZIDE) 12.5 MG capsule; Take 1 capsule (12.5 mg total) by mouth daily.  Dispense: 90 capsule; Refill: 3 - amLODipine (NORVASC) 5 MG tablet; Take 1 tablet (5 mg total) by mouth daily.  Dispense: 90 tablet; Refill: 3  3. Prediabetes Diet controlled. Will see if we can get Ozempic approved. - Semaglutide,0.25 or 0.5MG /DOS, (OZEMPIC, 0.25 OR 0.5 MG/DOSE,) 2 MG/1.5ML SOPN; Inject 0.25 mg into the skin once a week.  Dispense: 1.5 mL; Refill: 0  4. Morbid obesity with BMI of 40.0-44.9, adult ([place]) Continue healthy eating and trying to exercise (hard due to knee pain). Will see if we can get Ozempic covered to help. Advised if we do that he needs to return in 6 weeks to follow-up on his weight. - Semaglutide,0.25 or 0.5MG /DOS, (OZEMPIC, 0.25 OR 0.5 MG/DOSE,) 2 MG/1.5ML SOPN; Inject 0.25 mg into the skin once a week.  Dispense: 1.5 mL; Refill: 0  5. Chronic pain of both knees Does not desire work-up at this time. Handicap placard provided for long distances.  6. Personal history of colonic polyps Declined repeat colonoscopy at this time.  7. Need for immunization against influenza Influenza vaccine given in office today.    Follow-up: Return in about 1 year (around 02/07/2022) for annual physical.   Hendricks Limes, MSN, APRN, FNP-C [facility]  Subjective:  Patient ID: Ronald Pitts, male    DOB: Aug 16, 1950  Age: 70 y.o. MRN: [id number]  Patient Care Team: Loman Brooklyn, FNP as PCP - General (Family Medicine)   CC:  Chief Complaint  Patient presents with   Prediabetes   Hypertension    Check up of chronic medical conditions     HPI Ronald Pitts presents for his annual physical  Occupation: Retired, Marital status: Married, Substance use: Denies Diet: Low-carb, Exercise:  Yard work Last colonoscopy: 05/08/2015 with recommendation to repeat in 5 years Lung Cancer Screening with low-dose Chest CT: Declined AAA Screening: Declined Hepatitis C Screening: Completed PSA: 1.6 on 03/07/2018 Immunizations: Flu Vaccine: will get today Tdap Vaccine: up to date  COVID-19 Vaccine: up to date Pneumonia Vaccine: up to date  DEPRESSION SCREENING PHQ 2/9 Scores 11/07/2020 01/23/2020 01/05/2020 01/19/2019  PHQ - 2 Score 0 0 0 0     Review of Systems  Constitutional:  Negative for chills, fever, malaise/fatigue and weight loss.  HENT:  Negative for congestion, ear discharge, ear pain, nosebleeds, sinus pain, sore throat and tinnitus.   Eyes:  Negative for blurred vision, double vision, pain, discharge and redness.  Respiratory:  Positive for shortness of breath. Negative for cough and wheezing.   Cardiovascular:  Positive for leg swelling. Negative for chest pain and palpitations.  Gastrointestinal:  Negative for abdominal pain, constipation, diarrhea, heartburn, nausea and vomiting.  Genitourinary:  Negative for dysuria, frequency and urgency.  Musculoskeletal:  Positive for joint pain (knees). Negative for myalgias.  Skin:  Negative for rash.  Neurological:  Negative for dizziness, seizures, weakness and headaches.  Psychiatric/Behavioral:  Negative for depression, substance abuse and suicidal ideas. The patient is not nervous/anxious.     Current Outpatient Medications:    amLODipine (NORVASC) 5 MG tablet, TAKE 1 TABLET BY MOUTH EVERY DAY, Disp: 90 tablet, Rfl: 0   aspirin 81 MG tablet, Take by mouth daily., Disp: , Rfl:    dexamethasone (DECADRON) 6 MG tablet, Take 1  tablet (6 mg total) by mouth 2 (two) times daily with a meal., Disp: 10 tablet, Rfl: 0   fluticasone (FLONASE) 50 MCG/ACT nasal spray, SPRAY 2 SPRAYS INTO EACH NOSTRIL EVERY DAY, Disp: 48 mL, Rfl: 1   hydrochlorothiazide (MICROZIDE) 12.5 MG capsule, Take 1 capsule (12.5 mg total) by mouth daily. (NEEDS TO  BE SEEN BEFORE NEXT REFILL), Disp: 30 capsule, Rfl: 0   hydrOXYzine (ATARAX/VISTARIL) 25 MG tablet, Take 50 mg by mouth every 6 (six) hours as needed., Disp: , Rfl:    triamcinolone cream (KENALOG) 0.1 %, APPLY TO AFFECTED AREA TWICE A DAY, Disp: 80 g, Rfl: 2  No Known Allergies  Past Medical History:  Diagnosis Date   Hypertension    Personal history of colonic polyps 04/03/2010   Prediabetes     Past Surgical History:  Procedure Laterality Date   COLONOSCOPY     index finger     right    Family History  Problem Relation Age of Onset   Colon cancer Neg Hx     Social History   Socioeconomic History   Marital status: Married    Spouse name: Talbert Forest   Number of children: 2   Years of education: Not on file   Highest education level: Not on file  Occupational History   Occupation: retired  Tobacco Use   Smoking status: Former   Smokeless tobacco: Never  Building services engineer Use: Never used  Substance and Sexual Activity   Alcohol use: Yes    Alcohol/week: 0.0 standard drinks    Comment: OCC   Drug use: No   Sexual activity: Not on file  Other Topics Concern   Not on file  Social History Narrative   One level living with wife, Talbert Forest - son lives in [place], daughter in [place]   Social Determinants of Health   Financial Resource Strain: Low Risk    Difficulty of Paying Living Expenses: Not hard at all  Food Insecurity: No Food Insecurity   Worried About Programme researcher, broadcasting/film/video in the Last Year: Never true   Barista in the Last Year: Never true  Transportation Needs: No Transportation Needs   Lack of Transportation (Medical): No   Lack of Transportation (Non-Medical): No  Physical Activity: Insufficiently Active   Days of Exercise per Week: 7 days   Minutes of Exercise per Session: 10 min  Stress: No Stress Concern Present   Feeling of Stress : Not at all  Social Connections: Moderately Isolated   Frequency of Communication with Friends and Family:  More than three times a week   Frequency of Social Gatherings with Friends and Family: More than three times a week   Attends Religious Services: Never   Database administrator or Organizations: No   Attends Engineer, structural: Never   Marital Status: Married  Catering manager Violence: Not At Risk   Fear of Current or Ex-Partner: No   Emotionally Abused: No   Physically Abused: No   Sexually Abused: No      Objective:    BP 130/85   Pulse 81   Temp 97.9 F (36.6 C) (Temporal)   Ht 5\' 10"  (1.778 m)   Wt 289 lb 12.8 oz (131.5 kg)   SpO2 92%   BMI 41.58 kg/m   Wt Readings from Last 3 Encounters:  02/07/21 289 lb 12.8 oz (131.5 kg)  11/07/20 280 lb (127 kg)  01/23/20 284 lb (128.8 kg)  Physical Exam Vitals reviewed.  Constitutional:      General: He is not in acute distress.    Appearance: Normal appearance. He is morbidly obese. He is not ill-appearing, toxic-appearing or diaphoretic.  HENT:     Head: Normocephalic and atraumatic.     Right Ear: Tympanic membrane, ear canal and external ear normal. There is no impacted cerumen.     Left Ear: Tympanic membrane, ear canal and external ear normal. There is no impacted cerumen.     Nose: Nose normal. No congestion or rhinorrhea.     Mouth/Throat:     Mouth: Mucous membranes are moist.     Pharynx: Oropharynx is clear. No oropharyngeal exudate or posterior oropharyngeal erythema.  Eyes:     General: No scleral icterus.       Right eye: No discharge.        Left eye: No discharge.     Conjunctiva/sclera: Conjunctivae normal.     Pupils: Pupils are equal, round, and reactive to light.  Cardiovascular:     Rate and Rhythm: Normal rate and regular rhythm.     Heart sounds: Normal heart sounds. No murmur heard.   No friction rub. No gallop.  Pulmonary:     Effort: Pulmonary effort is normal. No respiratory distress.     Breath sounds: Normal breath sounds. No stridor. No wheezing, rhonchi or rales.   Abdominal:     General: Abdomen is flat. Bowel sounds are normal. There is no distension.     Palpations: Abdomen is soft. There is no mass.     Tenderness: There is no abdominal tenderness. There is no guarding or rebound.     Hernia: No hernia is present.  Musculoskeletal:        General: Normal range of motion.     Cervical back: Normal range of motion and neck supple. No rigidity. No muscular tenderness.     Right lower leg: Edema present.     Left lower leg: Edema present.  Lymphadenopathy:     Cervical: No cervical adenopathy.  Skin:    General: Skin is warm and dry.     Capillary Refill: Capillary refill takes less than 2 seconds.  Neurological:     General: No focal deficit present.     Mental Status: He is alert and oriented to person, place, and time. Mental status is at baseline.  Psychiatric:        Mood and Affect: Mood normal.        Behavior: Behavior normal.        Thought Content: Thought content normal.        Judgment: Judgment normal.   Lab Results  Component Value Date   TSH 1.490 01/23/2020   Lab Results  Component Value Date   WBC 8.8 02/03/2021   HGB 16.1 02/03/2021   HCT 47.1 02/03/2021   MCV 87 02/03/2021   PLT 276 02/03/2021   Lab Results  Component Value Date   NA 138 02/03/2021   K 4.7 02/03/2021   CO2 25 02/03/2021   GLUCOSE 129 (H) 02/03/2021   BUN 21 02/03/2021   CREATININE 0.96 02/03/2021   BILITOT 0.5 02/03/2021   ALKPHOS 84 02/03/2021   AST 21 02/03/2021   ALT 15 02/03/2021   PROT 6.8 02/03/2021   ALBUMIN 4.3 02/03/2021   CALCIUM 9.0 02/03/2021   EGFR 84 02/03/2021   Lab Results  Component Value Date   CHOL 119 02/03/2021   Lab Results  Component Value  Date   HDL 35 (L) 02/03/2021   Lab Results  Component Value Date   LDLCALC 64 02/03/2021   Lab Results  Component Value Date   TRIG 106 02/03/2021   Lab Results  Component Value Date   CHOLHDL 3.4 02/03/2021   Lab Results  Component Value Date   HGBA1C 5.9  (H) 02/03/2021

## 2021-02-07 NOTE — Patient Instructions (Signed)
If you are able to get the Ozempic, schedule an appointment to come back for your weight in 6 weeks.

## 2021-02-10 ENCOUNTER — Other Ambulatory Visit: Payer: Self-pay

## 2021-02-10 ENCOUNTER — Telehealth: Payer: Self-pay | Admitting: Family Medicine

## 2021-02-10 ENCOUNTER — Ambulatory Visit: Payer: Medicare Other

## 2021-02-10 DIAGNOSIS — R7303 Prediabetes: Secondary | ICD-10-CM

## 2021-02-10 NOTE — Progress Notes (Signed)
Patient here today for instruction on administering Ozempic to self.  Demonstrated to patient and patient voiced understanding.

## 2021-02-10 NOTE — Telephone Encounter (Signed)
Needs appt with nurse

## 2021-03-25 ENCOUNTER — Other Ambulatory Visit: Payer: Self-pay

## 2021-03-25 ENCOUNTER — Ambulatory Visit (INDEPENDENT_AMBULATORY_CARE_PROVIDER_SITE_OTHER): Payer: Medicare Other | Admitting: Family Medicine

## 2021-03-25 ENCOUNTER — Encounter: Payer: Self-pay | Admitting: Family Medicine

## 2021-03-25 VITALS — BP 125/85 | HR 80 | Temp 97.6°F | Ht 70.0 in | Wt 286.4 lb

## 2021-03-25 DIAGNOSIS — Z6841 Body Mass Index (BMI) 40.0 and over, adult: Secondary | ICD-10-CM

## 2021-03-25 DIAGNOSIS — R7303 Prediabetes: Secondary | ICD-10-CM | POA: Diagnosis not present

## 2021-03-25 MED ORDER — OZEMPIC (0.25 OR 0.5 MG/DOSE) 2 MG/1.5ML ~~LOC~~ SOPN: 0.5000 mg | PEN_INJECTOR | SUBCUTANEOUS | 1 refills | Status: DC

## 2021-03-25 NOTE — Progress Notes (Signed)
Assessment & Plan:  1. Morbid obesity with BMI of 40.0-44.9, adult ([place]) Improving. Increase Ozempic from 0.25 mg to 0.5 mg once weekly. Starting weight: 289 lbs in October 2022 Today's weight: 286 lbs Total weight lost: 3 lbs - Semaglutide,0.25 or 0.5MG/DOS, (OZEMPIC, 0.25 OR 0.5 MG/DOSE,) 2 MG/1.5ML SOPN; Inject 0.5 mg into the skin once a week.  Dispense: 1.5 mL; Refill: 1  2. Prediabetes - Semaglutide,0.25 or 0.5MG/DOS, (OZEMPIC, 0.25 OR 0.5 MG/DOSE,) 2 MG/1.5ML SOPN; Inject 0.5 mg into the skin once a week.  Dispense: 1.5 mL; Refill: 1   Return in about 6 weeks (around 05/06/2021) for Ozempic (may be telephone).  Hendricks Limes, MSN, APRN, FNP-C [facility]  Subjective:    Patient ID: Ronald Pitts, male    DOB: 08-Jun-1950, 70 y.o.   MRN: [id number]  Patient Care Team: Loman Brooklyn, FNP as PCP - General (Family Medicine)   Chief Complaint:  Chief Complaint  Patient presents with   6 week follow up     Prediabetes?     HPI: Ronald Pitts is a 70 y.o. male presenting on 03/25/2021 for 6 week follow up  (Prediabetes? )  Patient is following up on starting Ozempic 6 weeks ago for weight and prediabetes. He is tolerating 0.25 mg weekly. He does report he is eating less, but doesn't know if it is the medicine or not.  New complaints: None   Social history:  Relevant past medical, surgical, family and social history reviewed and updated as indicated. Interim medical history since our last visit reviewed.  Allergies and medications reviewed and updated.  DATA REVIEWED: CHART IN EPIC  ROS: Negative unless specifically indicated above in HPI.    Current Outpatient Medications:    amLODipine (NORVASC) 5 MG tablet, Take 1 tablet (5 mg total) by mouth daily., Disp: 90 tablet, Rfl: 3   aspirin 81 MG tablet, Take by mouth daily., Disp: , Rfl:    fluticasone (FLONASE) 50 MCG/ACT nasal spray, SPRAY 2 SPRAYS INTO EACH NOSTRIL EVERY DAY, Disp: 48 mL,  Rfl: 1   hydrochlorothiazide (MICROZIDE) 12.5 MG capsule, Take 1 capsule (12.5 mg total) by mouth daily., Disp: 90 capsule, Rfl: 3   hydrOXYzine (ATARAX/VISTARIL) 25 MG tablet, Take 50 mg by mouth every 6 (six) hours as needed., Disp: , Rfl:    Semaglutide,0.25 or 0.5MG/DOS, (OZEMPIC, 0.25 OR 0.5 MG/DOSE,) 2 MG/1.5ML SOPN, Inject 0.25 mg into the skin once a week., Disp: 1.5 mL, Rfl: 0   triamcinolone cream (KENALOG) 0.1 %, APPLY TO AFFECTED AREA TWICE A DAY, Disp: 80 g, Rfl: 2   No Known Allergies Past Medical History:  Diagnosis Date   Hypertension    Personal history of colonic polyps 04/03/2010   Prediabetes     Past Surgical History:  Procedure Laterality Date   COLONOSCOPY     index finger     right    Social History   Socioeconomic History   Marital status: Married    Spouse name: Enid Derry   Number of children: 2   Years of education: Not on file   Highest education level: Not on file  Occupational History   Occupation: retired  Tobacco Use   Smoking status: Former   Smokeless tobacco: Never  Scientific laboratory technician Use: Never used  Substance and Sexual Activity   Alcohol use: Yes    Alcohol/week: 0.0 standard drinks    Comment: OCC   Drug use: No   Sexual activity: Not on  file  Other Topics Concern   Not on file  Social History Narrative   One level living with wife, Enid Derry - son lives in [place], daughter in [place]   Social Determinants of Health   Financial Resource Strain: Low Risk    Difficulty of Paying Living Expenses: Not hard at all  Food Insecurity: No Food Insecurity   Worried About Charity fundraiser in the Last Year: Never true   Arboriculturist in the Last Year: Never true  Transportation Needs: No Transportation Needs   Lack of Transportation (Medical): No   Lack of Transportation (Non-Medical): No  Physical Activity: Insufficiently Active   Days of Exercise per Week: 7 days   Minutes of Exercise per Session: 10 min  Stress: No Stress  Concern Present   Feeling of Stress : Not at all  Social Connections: Moderately Isolated   Frequency of Communication with Friends and Family: More than three times a week   Frequency of Social Gatherings with Friends and Family: More than three times a week   Attends Religious Services: Never   Marine scientist or Organizations: No   Attends Music therapist: Never   Marital Status: Married  Human resources officer Violence: Not At Risk   Fear of Current or Ex-Partner: No   Emotionally Abused: No   Physically Abused: No   Sexually Abused: No        Objective:    BP 125/85   Pulse 80   Temp 97.6 F (36.4 C) (Temporal)   Ht _0  (1.778 m)   Wt 286 lb 6.4 oz (129.9 kg)   SpO2 92%   BMI 41.09 kg/m   Wt Readings from Last 3 Encounters:  03/25/21 286 lb 6.4 oz (129.9 kg)  02/07/21 289 lb 12.8 oz (131.5 kg)  11/07/20 280 lb (127 kg)    Physical Exam Vitals reviewed.  Constitutional:      General: He is not in acute distress.    Appearance: Normal appearance. He is morbidly obese. He is not ill-appearing, toxic-appearing or diaphoretic.  HENT:     Head: Normocephalic and atraumatic.  Eyes:     General: No scleral icterus.       Right eye: No discharge.        Left eye: No discharge.     Conjunctiva/sclera: Conjunctivae normal.  Cardiovascular:     Rate and Rhythm: Normal rate.  Pulmonary:     Effort: Pulmonary effort is normal. No respiratory distress.  Musculoskeletal:        General: Normal range of motion.     Cervical back: Normal range of motion.  Skin:    General: Skin is warm and dry.  Neurological:     Mental Status: He is alert and oriented to person, place, and time. Mental status is at baseline.  Psychiatric:        Mood and Affect: Mood normal.        Behavior: Behavior normal.        Thought Content: Thought content normal.        Judgment: Judgment normal.    Lab Results  Component Value Date   TSH 1.490 01/23/2020   Lab  Results  Component Value Date   WBC 8.8 02/03/2021   HGB 16.1 02/03/2021   HCT 47.1 02/03/2021   MCV 87 02/03/2021   PLT 276 02/03/2021   Lab Results  Component Value Date   NA 138 02/03/2021   K 4.7  02/03/2021   CO2 25 02/03/2021   GLUCOSE 129 (H) 02/03/2021   BUN 21 02/03/2021   CREATININE 0.96 02/03/2021   BILITOT 0.5 02/03/2021   ALKPHOS 84 02/03/2021   AST 21 02/03/2021   ALT 15 02/03/2021   PROT 6.8 02/03/2021   ALBUMIN 4.3 02/03/2021   CALCIUM 9.0 02/03/2021   EGFR 84 02/03/2021   Lab Results  Component Value Date   CHOL 119 02/03/2021   Lab Results  Component Value Date   HDL 35 (L) 02/03/2021   Lab Results  Component Value Date   LDLCALC 64 02/03/2021   Lab Results  Component Value Date   TRIG 106 02/03/2021   Lab Results  Component Value Date   CHOLHDL 3.4 02/03/2021   Lab Results  Component Value Date   HGBA1C 5.9 (H) 02/03/2021

## 2021-04-11 ENCOUNTER — Other Ambulatory Visit: Payer: Self-pay | Admitting: Family Medicine

## 2021-04-11 DIAGNOSIS — Z6841 Body Mass Index (BMI) 40.0 and over, adult: Secondary | ICD-10-CM

## 2021-04-11 DIAGNOSIS — R7303 Prediabetes: Secondary | ICD-10-CM

## 2021-05-06 ENCOUNTER — Telehealth: Payer: Medicare Other | Admitting: Family Medicine

## 2021-07-20 ENCOUNTER — Other Ambulatory Visit: Payer: Self-pay | Admitting: Family Medicine

## 2021-07-20 DIAGNOSIS — J302 Other seasonal allergic rhinitis: Secondary | ICD-10-CM

## 2021-11-10 ENCOUNTER — Ambulatory Visit (INDEPENDENT_AMBULATORY_CARE_PROVIDER_SITE_OTHER): Payer: Medicare Other

## 2021-11-10 VITALS — Wt 286.0 lb

## 2021-11-10 DIAGNOSIS — Z0000 Encounter for general adult medical examination without abnormal findings: Secondary | ICD-10-CM | POA: Diagnosis not present

## 2021-11-10 NOTE — Progress Notes (Signed)
Subjective:   Ronald Pitts is a 71 y.o. male who presents for Medicare Annual/Subsequent preventive examination.  Virtual Visit via Telephone Note  I connected with  Ronald Pitts on 11/10/21 at 10:30 AM EDT by telephone and verified that I am speaking with the correct person using two identifiers.  Location: Patient: Home Provider: [facility] Persons participating in the virtual visit: patient/Nurse Health Advisor   I discussed the limitations, risks, security and privacy concerns of performing an evaluation and management service by telephone and the availability of in person appointments. The patient expressed understanding and agreed to proceed.  Interactive audio and video telecommunications were attempted between this nurse and patient, however failed, due to patient having technical difficulties OR patient did not have access to video capability.  We continued and completed visit with audio only.  Some vital signs may be absent or patient reported.   Ozella Comins E Charlye Spare, LPN   Review of Systems     Cardiac Risk Factors include: advanced age (>34mn, >>52women);sedentary lifestyle;obesity (BMI >30kg/m2);dyslipidemia;hypertension;male gender     Objective:    Today's Vitals   11/10/21 1032  Weight: 286 lb (129.7 kg)   Body mass index is 41.04 kg/m.     11/10/2021   10:36 AM 11/07/2020   10:57 AM 01/02/2016    5:22 PM 11/02/2015    8:43 AM 04/24/2015    3:35 PM  Advanced Directives  Does Patient Have a Medical Advance Directive? Yes Yes Yes Yes Yes  Type of AParamedicof A[place]land SpringsLiving will [place] will [place]Living will Living will;Healthcare Power of Attorney Living will;Healthcare Power of Attorney  Does patient want to make changes to medical advance directive?     No - Patient declined  Copy of [place] Chart? No - copy requested No - copy requested   No - copy requested     Current Medications (verified) Outpatient Encounter Medications as of 11/10/2021  Medication Sig   amLODipine (NORVASC) 5 MG tablet Take 1 tablet (5 mg total) by mouth daily.   aspirin 81 MG tablet Take by mouth daily.   fluticasone (FLONASE) 50 MCG/ACT nasal spray SPRAY 2 SPRAYS INTO EACH NOSTRIL EVERY DAY   hydrochlorothiazide (MICROZIDE) 12.5 MG capsule Take 1 capsule (12.5 mg total) by mouth daily.   hydrOXYzine (ATARAX/VISTARIL) 25 MG tablet Take 50 mg by mouth every 6 (six) hours as needed.   triamcinolone cream (KENALOG) 0.1 % APPLY TO AFFECTED AREA TWICE A DAY   [DISCONTINUED] Semaglutide,0.25 or 0.'5MG'$ /DOS, (OZEMPIC, 0.25 OR 0.5 MG/DOSE,) 2 MG/1.5ML SOPN Inject 0.25 mg into the skin once a week. (Patient not taking: Reported on 11/10/2021)   No facility-administered encounter medications on file as of 11/10/2021.    Allergies (verified) Patient has no known allergies.   History: Past Medical History:  Diagnosis Date   Hypertension    Personal history of colonic polyps 04/03/2010   Prediabetes    Past Surgical History:  Procedure Laterality Date   COLONOSCOPY     index finger     right   Family History  Problem Relation Age of Onset   Colon cancer Neg Hx    Social History   Socioeconomic History   Marital status: Married    Spouse name: SEnid Derry  Number of children: 2   Years of education: Not on file   Highest education level: Not on file  Occupational History   Occupation: retired  Tobacco Use  Smoking status: Former   Smokeless tobacco: Never  Vaping Use   Vaping Use: Never used  Substance and Sexual Activity   Alcohol use: Yes    Alcohol/week: 0.0 standard drinks of alcohol    Comment: OCC   Drug use: No   Sexual activity: Not on file  Other Topics Concern   Not on file  Social History Narrative   One level living with wife, Enid Derry - son lives in [place], daughter in [place] Strain: [place]  (11/10/2021)   Overall Financial Resource Strain (CARDIA)    Difficulty of Paying Living Expenses: Not hard at all  Food Insecurity: No Food Insecurity (11/10/2021)   Hunger Vital Sign    Worried About Running Out of Food in the Last Year: Never true    Ran Out of Food in the Last Year: Never true  Transportation Needs: No Transportation Needs (11/10/2021)   PRAPARE - Hydrologist (Medical): No    Lack of Transportation (Non-Medical): No  Physical Activity: Inactive (11/10/2021)   Exercise Vital Sign    Days of Exercise per Week: 0 days    Minutes of Exercise per Session: 10 min  Stress: No Stress Concern Present (11/10/2021)   [place]    Feeling of Stress : Not at all  Social Connections: Moderately Isolated (11/10/2021)   Social Connection and Isolation Panel [NHANES]    Frequency of Communication with Friends and Family: More than three times a week    Frequency of Social Gatherings with Friends and Family: More than three times a week    Attends Religious Services: Never    Marine scientist or Organizations: No    Attends Music therapist: Never    Marital Status: Married    Tobacco Counseling Counseling given: Not Answered   Clinical Intake:  Pre-visit preparation completed: Yes  Pain : No/denies pain     BMI - recorded: 41.04 Nutritional Status: BMI > 30  Obese Nutritional Risks: None Diabetes: No  How often do you need to have someone help you when you read instructions, pamphlets, or other written materials from your doctor or pharmacy?: 1 - Never  Diabetic? no  Interpreter Needed?: No  Information entered by :: Terik Haughey, LPN   Activities of Daily Living    11/10/2021   10:36 AM  In your present state of health, do you have any difficulty performing the following activities:  Hearing? 0  Vision? 0  Difficulty concentrating or making  decisions? 0  Walking or climbing stairs? 0  Dressing or bathing? 0  Doing errands, shopping? 0  Preparing Food and eating ? N  Using the Toilet? N  In the past six months, have you accidently leaked urine? N  Do you have problems with loss of bowel control? N  Managing your Medications? N  Managing your Finances? N  Housekeeping or managing your Housekeeping? N    Patient Care Team: Loman Brooklyn, FNP as PCP - General (Family Medicine) Celestia Khat, OD (Optometry)  Indicate any recent Medical Services you may have received from other than [facility] providers in the past year (date may be approximate).     Assessment:   This is a routine wellness examination for [employer].  Hearing/Vision screen Hearing Screening - Comments:: Denies hearing difficulties   Vision Screening - Comments:: Wears rx glasses - up to  date with routine eye exams with [facility]  Dietary issues and exercise activities discussed: Current Exercise Habits: Home exercise routine, Type of exercise: walking, Time (Minutes): 15, Frequency (Times/Week): 7, Weekly Exercise (Minutes/Week): 105, Intensity: Mild, Exercise limited by: orthopedic condition(s)   Goals Addressed             This Visit's Progress    Patient Stated   On track    Wants to stay healthy       Depression Screen    11/10/2021   10:35 AM 03/25/2021    8:31 AM 02/07/2021    1:09 PM 11/07/2020   10:40 AM 01/23/2020    9:21 AM 01/05/2020    8:16 AM 01/19/2019    8:11 AM  PHQ 2/9 Scores  PHQ - 2 Score 0 0 0 0 0 0 0  PHQ- 9 Score  0 0        Fall Risk    11/10/2021   10:33 AM 03/25/2021    8:31 AM 02/07/2021    1:09 PM 11/07/2020   10:55 AM 01/23/2020    9:20 AM  Fall Risk   Falls in the past year? 0 0 0 0 0  Number falls in past yr: 0   0   Injury with Fall? 0   0   Risk for fall due to : Orthopedic patient   Impaired vision   Follow up Falls prevention discussed   Falls prevention discussed     FALL RISK PREVENTION  PERTAINING TO THE HOME:  Any stairs in or around the home? No  If so, are there any without handrails? No  Home free of loose throw rugs in walkways, pet beds, electrical cords, etc? Yes  Adequate lighting in your home to reduce risk of falls? Yes   ASSISTIVE DEVICES UTILIZED TO PREVENT FALLS:  Life alert? No  Use of a cane, walker or w/c? No  Grab bars in the bathroom? Yes  Shower chair or bench in shower? Yes  Elevated toilet seat or a handicapped toilet? Yes   TIMED UP AND GO:  Was the test performed? No . Telephonic visit  Cognitive Function:        11/10/2021   10:37 AM 11/07/2020   10:57 AM  6CIT Screen  What Year? 0 points 0 points  What month? 0 points 0 points  What time? 0 points 0 points  Count back from 20 0 points 0 points  Months in reverse 0 points 0 points  Repeat phrase 0 points 0 points  Total Score 0 points 0 points    Immunizations Immunization History  Administered Date(s) Administered   Fluad Quad(high Dose 65+) 01/19/2019, 01/23/2020, 02/07/2021   Influenza Split 04/27/2012, 01/30/2020   Influenza, High Dose Seasonal PF 03/08/2015, 02/19/2016, 01/27/2017, 03/07/2018   Influenza,trivalent, recombinat, inj, PF 03/06/2014   Influenza-Unspecified 03/06/2014   PFIZER(Purple Top)SARS-COV-2 Vaccination 05/17/2019, 06/07/2019, 03/06/2020, 05/12/2020   Pneumococcal Conjugate-13 03/08/2015   Pneumococcal Polysaccharide-23 04/27/2009, 02/19/2016   Tdap 01/03/2016    TDAP status: Up to date  Flu Vaccine status: Up to date  Pneumococcal vaccine status: Up to date  Covid-19 vaccine status: Completed vaccines  Qualifies for Shingles Vaccine? Yes   Zostavax completed Yes   Shingrix Completed?: No.    Education has been provided regarding the importance of this vaccine. Patient has been advised to call insurance company to determine out of pocket expense if they have not yet received this vaccine. Advised may also receive vaccine at local  pharmacy or  Health Dept. Verbalized acceptance and understanding.  Screening Tests Health Maintenance  Topic Date Due   Zoster Vaccines- Shingrix (1 of 2) Never done   COVID-19 Vaccine (5 - Pfizer series) 07/07/2020   COLONOSCOPY (Pts 45-62yr Insurance coverage will need to be confirmed)  02/07/2022 (Originally 05/07/2020)   INFLUENZA VACCINE  11/25/2021   TETANUS/TDAP  01/02/2026   Pneumonia Vaccine 71 Years old  Completed   Hepatitis C Screening  Completed   HPV VACCINES  Aged Out    Health Maintenance  Health Maintenance Due  Topic Date Due   Zoster Vaccines- Shingrix (1 of 2) Never done   COVID-19 Vaccine (5 - Pfizer series) 07/07/2020    Colorectal cancer screening: Type of screening: Colonoscopy. Completed 05/08/2015. Repeat every 5 years - declines at this time  Lung Cancer Screening: (Low Dose CT Chest recommended if Age 73-80years, 30 pack-year currently smoking OR have quit w/in 15years.) does not qualify  Additional Screening:  Hepatitis C Screening: does qualify; Completed 01/19/2019  Vision Screening: Recommended annual ophthalmology exams for early detection of glaucoma and other disorders of the eye. Is the patient up to date with their annual eye exam?  Yes  Who is the provider or what is the name of the office in which the patient attends annual eye exams? [facility] is not established with a provider, would they like to be referred to a provider to establish care? No .   Dental Screening: Recommended annual dental exams for proper oral hygiene  Community Resource Referral / Chronic Care Management: CRR required this visit?  No   CCM required this visit?  No      Plan:     I have personally reviewed and noted the following in the patient's chart:   Medical and social history Use of alcohol, tobacco or illicit drugs  Current medications and supplements including opioid prescriptions. Patient is not currently taking opioid prescriptions. Functional  ability and status Nutritional status Physical activity Advanced directives List of other physicians Hospitalizations, surgeries, and ER visits in previous 12 months Vitals Screenings to include cognitive, depression, and falls Referrals and appointments  In addition, I have reviewed and discussed with patient certain preventive protocols, quality metrics, and best practice recommendations. A written personalized care plan for preventive services as well as general preventive health recommendations were provided to patient.     ASandrea Hammond LPN   [id number]  Nurse Notes: None

## 2021-11-10 NOTE — Patient Instructions (Signed)
Mr. Ronald Pitts , Thank you for taking time to come for your Medicare Wellness Visit. I appreciate your ongoing commitment to your health goals. Please review the following plan we discussed and let me know if I can assist you in the future.   Screening recommendations/referrals: Colonoscopy: Done 05/08/2015 - recommended Repeat in 5 years *consider scheduling soon Recommended yearly ophthalmology/optometry visit for glaucoma screening and checkup Recommended yearly dental visit for hygiene and checkup  Vaccinations: Influenza vaccine: Done 02/07/2021 - Repeat annually  Pneumococcal vaccine: Done 03/08/2015 & 02/19/2016 Tdap vaccine: Done 01/03/2016 - Repeat in 10 years Shingles vaccine: Due - Shingrix is 2 doses 2-6 months apart and over 90% effective     Covid-19: Done 05/17/2019, 06/07/2019, 03/06/2020, & 03/12/2021  Advanced directives: Please bring a copy of your health care power of attorney and living will to the office to be added to your chart at your convenience.   Conditions/risks identified: Aim for 6-8 glasses of water daily, plenty of lean protein and vegetables in your diet and try to get up and walk/ stretch every hour for 5-10 minutes at a time.   Next appointment: Follow up in one year for your annual wellness visit.   Preventive Care 73 Years and Older, Male  Preventive care refers to lifestyle choices and visits with your health care provider that can promote health and wellness. What does preventive care include? A yearly physical exam. This is also called an annual well check. Dental exams once or twice a year. Routine eye exams. Ask your health care provider how often you should have your eyes checked. Personal lifestyle choices, including: Daily care of your teeth and gums. Regular physical activity. Eating a healthy diet. Avoiding tobacco and drug use. Limiting alcohol use. Practicing safe sex. Taking low doses of aspirin every day. Taking vitamin and mineral  supplements as recommended by your health care provider. What happens during an annual well check? The services and screenings done by your health care provider during your annual well check will depend on your age, overall health, lifestyle risk factors, and family history of disease. Counseling  Your health care provider may ask you questions about your: Alcohol use. Tobacco use. Drug use. Emotional well-being. Home and relationship well-being. Sexual activity. Eating habits. History of falls. Memory and ability to understand (cognition). Work and work Statistician. Screening  You may have the following tests or measurements: Height, weight, and BMI. Blood pressure. Lipid and cholesterol levels. These may be checked every 5 years, or more frequently if you are over 5 years old. Skin check. Lung cancer screening. You may have this screening every year starting at age 48 if you have a 30-pack-year history of smoking and currently smoke or have quit within the past 15 years. Fecal occult blood test (FOBT) of the stool. You may have this test every year starting at age 25. Flexible sigmoidoscopy or colonoscopy. You may have a sigmoidoscopy every 5 years or a colonoscopy every 10 years starting at age 79. Prostate cancer screening. Recommendations will vary depending on your family history and other risks. Hepatitis C blood test. Hepatitis B blood test. Sexually transmitted disease (STD) testing. Diabetes screening. This is done by checking your blood sugar (glucose) after you have not eaten for a while (fasting). You may have this done every 1-3 years. Abdominal aortic aneurysm (AAA) screening. You may need this if you are a current or former smoker. Osteoporosis. You may be screened starting at age 61 if you are at  high risk. Talk with your health care provider about your test results, treatment options, and if necessary, the need for more tests. Vaccines  Your health care provider  may recommend certain vaccines, such as: Influenza vaccine. This is recommended every year. Tetanus, diphtheria, and acellular pertussis (Tdap, Td) vaccine. You may need a Td booster every 10 years. Zoster vaccine. You may need this after age 27. Pneumococcal 13-valent conjugate (PCV13) vaccine. One dose is recommended after age 16. Pneumococcal polysaccharide (PPSV23) vaccine. One dose is recommended after age 28. Talk to your health care provider about which screenings and vaccines you need and how often you need them. This information is not intended to replace advice given to you by your health care provider. Make sure you discuss any questions you have with your health care provider. Document Released: 05/10/2015 Document Revised: 01/01/2016 Document Reviewed: 02/12/2015 Elsevier Interactive Patient Education  2017 [place] Prevention in the Home Falls can cause injuries. They can happen to people of all ages. There are many things you can do to make your home safe and to help prevent falls. What can I do on the outside of my home? Regularly fix the edges of walkways and driveways and fix any cracks. Remove anything that might make you trip as you walk through a door, such as a raised step or threshold. Trim any bushes or trees on the path to your home. Use bright outdoor lighting. Clear any walking paths of anything that might make someone trip, such as rocks or tools. Regularly check to see if handrails are loose or broken. Make sure that both sides of any steps have handrails. Any raised decks and porches should have guardrails on the edges. Have any leaves, snow, or ice cleared regularly. Use sand or salt on walking paths during winter. Clean up any spills in your garage right away. This includes oil or grease spills. What can I do in the bathroom? Use night lights. Install grab bars by the toilet and in the tub and shower. Do not use towel bars as grab bars. Use  non-skid mats or decals in the tub or shower. If you need to sit down in the shower, use a plastic, non-slip stool. Keep the floor dry. Clean up any water that spills on the floor as soon as it happens. Remove soap buildup in the tub or shower regularly. Attach bath mats securely with double-sided non-slip rug tape. Do not have throw rugs and other things on the floor that can make you trip. What can I do in the bedroom? Use night lights. Make sure that you have a light by your bed that is easy to reach. Do not use any sheets or blankets that are too big for your bed. They should not hang down onto the floor. Have a firm chair that has side arms. You can use this for support while you get dressed. Do not have throw rugs and other things on the floor that can make you trip. What can I do in the kitchen? Clean up any spills right away. Avoid walking on wet floors. Keep items that you use a lot in easy-to-reach places. If you need to reach something above you, use a strong step stool that has a grab bar. Keep electrical cords out of the way. Do not use floor polish or wax that makes floors slippery. If you must use wax, use non-skid floor wax. Do not have throw rugs and other things on the floor that  can make you trip. What can I do with my stairs? Do not leave any items on the stairs. Make sure that there are handrails on both sides of the stairs and use them. Fix handrails that are broken or loose. Make sure that handrails are as long as the stairways. Check any carpeting to make sure that it is firmly attached to the stairs. Fix any carpet that is loose or worn. Avoid having throw rugs at the top or bottom of the stairs. If you do have throw rugs, attach them to the floor with carpet tape. Make sure that you have a light switch at the top of the stairs and the bottom of the stairs. If you do not have them, ask someone to add them for you. What else can I do to help prevent falls? Wear  shoes that: Do not have high heels. Have rubber bottoms. Are comfortable and fit you well. Are closed at the toe. Do not wear sandals. If you use a stepladder: Make sure that it is fully opened. Do not climb a closed stepladder. Make sure that both sides of the stepladder are locked into place. Ask someone to hold it for you, if possible. Clearly mark and make sure that you can see: Any grab bars or handrails. First and last steps. Where the edge of each step is. Use tools that help you move around (mobility aids) if they are needed. These include: Canes. Walkers. Scooters. Crutches. Turn on the lights when you go into a dark area. Replace any light bulbs as soon as they burn out. Set up your furniture so you have a clear path. Avoid moving your furniture around. If any of your floors are uneven, fix them. If there are any pets around you, be aware of where they are. Review your medicines with your doctor. Some medicines can make you feel dizzy. This can increase your chance of falling. Ask your doctor what other things that you can do to help prevent falls. This information is not intended to replace advice given to you by your health care provider. Make sure you discuss any questions you have with your health care provider. Document Released: 02/07/2009 Document Revised: 09/19/2015 Document Reviewed: 05/18/2014 Elsevier Interactive Patient Education  2017 [employer].

## 2021-12-01 ENCOUNTER — Encounter: Payer: Self-pay | Admitting: *Deleted

## 2022-01-01 DIAGNOSIS — M1612 Unilateral primary osteoarthritis, left hip: Secondary | ICD-10-CM | POA: Diagnosis not present

## 2022-01-04 DIAGNOSIS — M1612 Unilateral primary osteoarthritis, left hip: Secondary | ICD-10-CM | POA: Insufficient documentation

## 2022-01-25 ENCOUNTER — Other Ambulatory Visit: Payer: Self-pay | Admitting: Family Medicine

## 2022-01-25 DIAGNOSIS — I10 Essential (primary) hypertension: Secondary | ICD-10-CM

## 2022-01-28 DIAGNOSIS — M25552 Pain in left hip: Secondary | ICD-10-CM | POA: Diagnosis not present

## 2022-02-10 ENCOUNTER — Encounter: Payer: Self-pay | Admitting: Family Medicine

## 2022-02-10 ENCOUNTER — Ambulatory Visit (INDEPENDENT_AMBULATORY_CARE_PROVIDER_SITE_OTHER): Payer: Medicare Other | Admitting: Family Medicine

## 2022-02-10 VITALS — BP 130/80 | HR 72 | Temp 97.1°F | Ht 70.0 in | Wt 287.8 lb

## 2022-02-10 DIAGNOSIS — R7303 Prediabetes: Secondary | ICD-10-CM | POA: Diagnosis not present

## 2022-02-10 DIAGNOSIS — Z86010 Personal history of colonic polyps: Secondary | ICD-10-CM

## 2022-02-10 DIAGNOSIS — Z6841 Body Mass Index (BMI) 40.0 and over, adult: Secondary | ICD-10-CM

## 2022-02-10 DIAGNOSIS — Z0000 Encounter for general adult medical examination without abnormal findings: Secondary | ICD-10-CM

## 2022-02-10 DIAGNOSIS — I10 Essential (primary) hypertension: Secondary | ICD-10-CM

## 2022-02-10 DIAGNOSIS — R972 Elevated prostate specific antigen [PSA]: Secondary | ICD-10-CM

## 2022-02-10 DIAGNOSIS — Z23 Encounter for immunization: Secondary | ICD-10-CM

## 2022-02-10 DIAGNOSIS — Z0001 Encounter for general adult medical examination with abnormal findings: Secondary | ICD-10-CM

## 2022-02-10 DIAGNOSIS — R739 Hyperglycemia, unspecified: Secondary | ICD-10-CM | POA: Diagnosis not present

## 2022-02-10 LAB — BAYER DCA HB A1C WAIVED: HB A1C (BAYER DCA - WAIVED): 6.4 % — ABNORMAL HIGH (ref 4.8–5.6)

## 2022-02-10 MED ORDER — AMLODIPINE BESYLATE 5 MG PO TABS: 5.0000 mg | ORAL_TABLET | Freq: Every day | ORAL | 1 refills | Status: DC

## 2022-02-10 NOTE — Progress Notes (Signed)
Ronald Pitts is a 71 y.o. male presents to office today for annual physical exam examination.    Concerns today include: 1. None  Occupation: Retired Marital status: Married Substance use: Denies Diet: fairly healthy  Exercise: hard to due to bilateral knee pain, does do yard work Last eye exam: yearly Last dental exam: regular Last colonoscopy: 2017, several polyps, declines repeat screening at this time Refills needed today: Norvasc Immunizations needed: Flu Vaccine: yes  Tdap Vaccine: no  Zoster Vaccine: yes, declined  Pneumonia Vaccine: no  Past Medical History:  Diagnosis Date   Hypertension    Personal history of colonic polyps 04/03/2010   Prediabetes    Social History   Socioeconomic History   Marital status: Married    Spouse name: Ronald Pitts   Number of children: 2   Years of education: Not on file   Highest education level: Not on file  Occupational History   Occupation: retired  Tobacco Use   Smoking status: Former   Smokeless tobacco: Never  Scientific laboratory technician Use: Never used  Substance and Sexual Activity   Alcohol use: Yes    Alcohol/week: 0.0 standard drinks of alcohol    Comment: OCC   Drug use: No   Sexual activity: Not on file  Other Topics Concern   Not on file  Social History Narrative   One level living with wife, Ronald Pitts - son lives in [place], daughter in [place] Strain: [place]  (11/10/2021)   Overall Financial Resource Strain (CARDIA)    Difficulty of Paying Living Expenses: Not hard at all  Food Insecurity: No Food Insecurity (11/10/2021)   Hunger Vital Sign    Worried About Running Out of Food in the Last Year: Never true    Ran Out of Food in the Last Year: Never true  Transportation Needs: No Transportation Needs (11/10/2021)   PRAPARE - Hydrologist (Medical): No    Lack of Transportation (Non-Medical): No  Physical Activity: Inactive  (11/10/2021)   Exercise Vital Sign    Days of Exercise per Week: 0 days    Minutes of Exercise per Session: 10 min  Stress: No Stress Concern Present (11/10/2021)   [place]    Feeling of Stress : Not at all  Social Connections: Moderately Isolated (11/10/2021)   Social Connection and Isolation Panel [NHANES]    Frequency of Communication with Friends and Family: More than three times a week    Frequency of Social Gatherings with Friends and Family: More than three times a week    Attends Religious Services: Never    Marine scientist or Organizations: No    Attends Archivist Meetings: Never    Marital Status: Married  Human resources officer Violence: Not At Risk (11/10/2021)   Humiliation, Afraid, Rape, and Kick questionnaire    Fear of Current or Ex-Partner: No    Emotionally Abused: No    Physically Abused: No    Sexually Abused: No   Past Surgical History:  Procedure Laterality Date   COLONOSCOPY     index finger     right   Family History  Problem Relation Age of Onset   Colon cancer Neg Hx     Current Outpatient Medications:    aspirin 81 MG tablet, Take by mouth daily., Disp: , Rfl:    fluticasone (FLONASE) 50 MCG/ACT nasal  spray, SPRAY 2 SPRAYS INTO EACH NOSTRIL EVERY DAY, Disp: 48 mL, Rfl: 1   hydrochlorothiazide (MICROZIDE) 12.5 MG capsule, TAKE 1 CAPSULE BY MOUTH EVERY DAY, Disp: 90 capsule, Rfl: 0   hydrOXYzine (ATARAX/VISTARIL) 25 MG tablet, Take 50 mg by mouth every 6 (six) hours as needed., Disp: , Rfl:    triamcinolone cream (KENALOG) 0.1 %, APPLY TO AFFECTED AREA TWICE A DAY, Disp: 80 g, Rfl: 2   amLODipine (NORVASC) 5 MG tablet, Take 1 tablet (5 mg total) by mouth daily., Disp: 90 tablet, Rfl: 1  No Known Allergies   ROS: Review of Systems Constitutional: negative Eyes: negative Ears, nose, mouth, throat, and face: negative Respiratory: negative Cardiovascular:  negative Gastrointestinal: negative Genitourinary:positive for nocturia Integument/breast: negative Hematologic/lymphatic: negative Musculoskeletal:positive for arthralgias Neurological: negative Behavioral/Psych: negative Endocrine: negative Allergic/Immunologic: negative    Physical exam BP 130/80   Pulse 72   Temp (!) 97.1 F (36.2 C) (Temporal)   Ht _0  (1.778 m)   Wt 287 lb 12.8 oz (130.5 kg)   SpO2 94%   BMI 41.30 kg/m  General appearance: alert, cooperative, appears stated age, no distress, and morbidly obese Head: Normocephalic, without obvious abnormality, atraumatic Eyes: conjunctivae/corneas clear. PERRL, EOM's intact. Fundi benign. Ears: normal TM's and external ear canals both ears Nose: Nares normal. Septum midline. Mucosa normal. No drainage or sinus tenderness. Throat: lips, mucosa, and tongue normal; teeth and gums normal Neck: no adenopathy, no carotid bruit, no JVD, supple, symmetrical, trachea midline, and thyroid not enlarged, symmetric, no tenderness/mass/nodules Back: symmetric, no curvature. ROM normal. No CVA tenderness. Lungs: clear to auscultation bilaterally and normal percussion bilaterally Chest wall: no tenderness Heart: regular rate and rhythm, S1, S2 normal, no murmur, click, rub or gallop Abdomen: soft, non-tender; bowel sounds normal; no masses,  no organomegaly and obese abdomen Extremities: extremities normal, atraumatic, no cyanosis or edema Pulses: 2+ and symmetric Skin: Skin color, texture, turgor normal. No rashes or lesions Lymph nodes: Cervical, supraclavicular, and axillary nodes normal. Neurologic: Alert and oriented X 3, normal strength and tone. Normal symmetric reflexes. Normal coordination and gait    Assessment/ Plan: Ronald Pitts was seen today for establish care and annual exam.  Diagnoses and all orders for this visit:  Annual physical exam Health maintenance discussed in detail. Labs updated today. Declines repeat  colonoscopy and zoster vaccine.  -     CBC with Differential/Platelet -     CMP14+EGFR -     PSA, total and free -     Thyroid Panel With TSH -     Lipid panel -     Bayer DCA Hb A1c Waived -     Microalbumin / creatinine urine ratio -     Flu Vaccine QUAD High Dose(Fluad)  Personal history of colonic polyps Declined repeat screening at this time.   Essential (primary) hypertension BP well controlled. Changes were not made in regimen today. Goal BP is 130/80. Pt aware to report any persistent high or low readings. DASH diet and exercise encouraged. Exercise at least 150 minutes per week and increase as tolerated. Goal BMI > 25. Stress management encouraged. Avoid nicotine and tobacco product use. Avoid excessive alcohol and NSAID's. Avoid more than 2000 mg of sodium daily. Medications as prescribed. Follow up as scheduled.  -     amLODipine (NORVASC) 5 MG tablet; Take 1 tablet (5 mg total) by mouth daily. -     CBC with Differential/Platelet -     CMP14+EGFR -  Thyroid Panel With TSH -     Lipid panel -     Microalbumin / creatinine urine ratio  Prediabetes Labs pending. Diet and exercise encouraged.  -     CMP14+EGFR -     Bayer DCA Hb A1c Waived  Morbid obesity with BMI of 40.0-44.9, adult ([place]) Labs pending. Diet and exercise encouraged.  -     CBC with Differential/Platelet -     CMP14+EGFR -     Thyroid Panel With TSH -     Lipid panel -     Bayer DCA Hb A1c Waived  Need for immunization against influenza -     Flu Vaccine QUAD High Dose(Fluad)      Counseled on healthy lifestyle choices, including diet (rich in fruits, vegetables and lean meats and low in salt and simple carbohydrates) and exercise (at least 30 minutes of moderate physical activity daily).  Patient to follow up in 1 year for annual exam or sooner if needed.  The above assessment and management plan was discussed with the patient. The patient verbalized understanding of and has agreed to the  management plan. Patient is aware to call the clinic if symptoms persist or worsen. Patient is aware when to return to the clinic for a follow-up visit. Patient educated on when it is appropriate to go to the emergency department.   Monia Pouch, FNP-C [facility] [address] [place], [place] [address] [phone]

## 2022-02-11 LAB — CMP14+EGFR
ALT: 11 IU/L (ref 0–44)
AST: 18 IU/L (ref 0–40)
Albumin/Globulin Ratio: 1.8 (ref 1.2–2.2)
Albumin: 4.2 g/dL (ref 3.8–4.8)
Alkaline Phosphatase: 89 IU/L (ref 44–121)
BUN/Creatinine Ratio: 14 (ref 10–24)
BUN: 15 mg/dL (ref 8–27)
Bilirubin Total: 0.4 mg/dL (ref 0.0–1.2)
CO2: 30 mmol/L — ABNORMAL HIGH (ref 20–29)
Calcium: 9 mg/dL (ref 8.6–10.2)
Chloride: 96 mmol/L (ref 96–106)
Creatinine, Ser: 1.07 mg/dL (ref 0.76–1.27)
Globulin, Total: 2.3 g/dL (ref 1.5–4.5)
Glucose: 144 mg/dL — ABNORMAL HIGH (ref 70–99)
Potassium: 4.6 mmol/L (ref 3.5–5.2)
Sodium: 140 mmol/L (ref 134–144)
Total Protein: 6.5 g/dL (ref 6.0–8.5)
eGFR: 73 mL/min/{1.73_m2} (ref >59)

## 2022-02-11 LAB — THYROID PANEL WITH TSH
Free Thyroxine Index: 2.5 (ref 1.2–4.9)
T3 Uptake Ratio: 29 % (ref 24–39)
T4, Total: 8.5 ug/dL (ref 4.5–12.0)
TSH: 1.29 u[IU]/mL (ref 0.450–4.500)

## 2022-02-11 LAB — LIPID PANEL
Chol/HDL Ratio: 2.4 ratio (ref 0.0–5.0)
Cholesterol, Total: 115 mg/dL (ref 100–199)
HDL: 48 mg/dL (ref >39)
LDL Chol Calc (NIH): 52 mg/dL (ref 0–99)
Triglycerides: 70 mg/dL (ref 0–149)
VLDL Cholesterol Cal: 15 mg/dL (ref 5–40)

## 2022-02-11 LAB — CBC WITH DIFFERENTIAL/PLATELET
Basophils Absolute: 0.1 10*3/uL (ref 0.0–0.2)
Basos: 1 %
EOS (ABSOLUTE): 0.1 10*3/uL (ref 0.0–0.4)
Eos: 1 %
Hematocrit: 48.3 % (ref 37.5–51.0)
Hemoglobin: 16.2 g/dL (ref 13.0–17.7)
Immature Grans (Abs): 0.1 10*3/uL (ref 0.0–0.1)
Immature Granulocytes: 1 %
Lymphocytes Absolute: 1.9 10*3/uL (ref 0.7–3.1)
Lymphs: 20 %
MCH: 29.1 pg (ref 26.6–33.0)
MCHC: 33.5 g/dL (ref 31.5–35.7)
MCV: 87 fL (ref 79–97)
Monocytes Absolute: 0.8 10*3/uL (ref 0.1–0.9)
Monocytes: 8 %
Neutrophils Absolute: 6.4 10*3/uL (ref 1.4–7.0)
Neutrophils: 69 %
Platelets: 277 10*3/uL (ref 150–450)
RBC: 5.56 x10E6/uL (ref 4.14–5.80)
RDW: 11.9 % (ref 11.6–15.4)
WBC: 9.3 10*3/uL (ref 3.4–10.8)

## 2022-02-11 LAB — PSA, TOTAL AND FREE
PSA, Free Pct: 11 %
PSA, Free: 0.57 ng/mL
Prostate Specific Ag, Serum: 5.2 ng/mL — ABNORMAL HIGH (ref 0.0–4.0)

## 2022-02-11 LAB — MICROALBUMIN / CREATININE URINE RATIO
Creatinine, Urine: 132 mg/dL
Microalb/Creat Ratio: 27 mg/g creat (ref 0–29)
Microalbumin, Urine: 35.4 ug/mL

## 2022-02-11 NOTE — Addendum Note (Signed)
Addended by: Baruch Gouty on: 02/11/2022 04:12 PM   Modules accepted: Orders

## 2022-02-12 ENCOUNTER — Encounter: Payer: Self-pay | Admitting: Family Medicine

## 2022-03-27 DIAGNOSIS — C61 Malignant neoplasm of prostate: Secondary | ICD-10-CM

## 2022-03-27 HISTORY — DX: Malignant neoplasm of prostate: C61

## 2022-04-23 ENCOUNTER — Other Ambulatory Visit: Payer: Self-pay | Admitting: Family Medicine

## 2022-04-23 DIAGNOSIS — I10 Essential (primary) hypertension: Secondary | ICD-10-CM

## 2022-04-29 NOTE — Progress Notes (Signed)
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  No Pacemaker/ICD? No Possible current pregnancy? Male Is the patient on methotrexate? No  Current Complaints / other details:

## 2022-05-03 NOTE — Progress Notes (Signed)
Radiation Oncology         [phone] ________________________________  Initial Outpatient Consultation - Conducted via Telephone due to current COVID-19 concerns for limiting patient exposure  Name: Ronald Pitts MRN: [id number]  Date: 05/04/2022  DOB: [birth year]  JS:HFWYO, Connye Burkitt, FNP  Robley Fries, MD   REFERRING PHYSICIAN: Robley Fries, MD  DIAGNOSIS: 72 y.o. gentleman with Stage T1c adenocarcinoma of the prostate with Gleason score of 4+3, and PSA of 5.2.  No diagnosis found.  HISTORY OF PRESENT ILLNESS: Ronald Pitts is a 72 y.o. male with a diagnosis of prostate cancer. He was noted to have an elevated PSA of 5.2 by his primary care physician, Darla Lesches, [place].  Accordingly, he was referred for evaluation in urology by Dr. Claudia Desanctis on 03/04/22,  digital rectal examination performed at that time was normal.  The patient proceeded to transrectal ultrasound with 12 biopsies of the prostate on 04/08/22.  The prostate volume measured 20.77 cc.  Out of 12 core biopsies, 5 were positive.  The maximum Gleason score was 4+3, and this was seen in right base and left apex lateral. Additionally, Gleason 3+4 was seen in right mid, right base lateral (with perineural invasion), and right mid lateral (small focus, with PNI).  The patient reviewed the biopsy results with his urologist and he has kindly been referred today for discussion of potential radiation treatment options.   PREVIOUS RADIATION THERAPY: No  PAST MEDICAL HISTORY:  Past Medical History:  Diagnosis Date   Hypertension    Personal history of colonic polyps 04/03/2010   Prediabetes       PAST SURGICAL HISTORY: Past Surgical History:  Procedure Laterality Date   COLONOSCOPY     index finger     right    FAMILY HISTORY:  Family History  Problem Relation Age of Onset   Colon cancer Neg Hx     SOCIAL HISTORY:  Social History   Socioeconomic History   Marital status: Married    Spouse name: Enid Derry   Number  of children: 2   Years of education: Not on file   Highest education level: Not on file  Occupational History   Occupation: retired  Tobacco Use   Smoking status: Former   Smokeless tobacco: Never  Scientific laboratory technician Use: Never used  Substance and Sexual Activity   Alcohol use: Yes    Alcohol/week: 0.0 standard drinks of alcohol    Comment: OCC   Drug use: No   Sexual activity: Not on file  Other Topics Concern   Not on file  Social History Narrative   One level living with wife, Enid Derry - son lives in [place], daughter in [place] Strain: [place]  (11/10/2021)   Overall Financial Resource Strain (CARDIA)    Difficulty of Paying Living Expenses: Not hard at all  Food Insecurity: No Food Insecurity (11/10/2021)   Hunger Vital Sign    Worried About Running Out of Food in the Last Year: Never true    Ran Out of Food in the Last Year: Never true  Transportation Needs: No Transportation Needs (11/10/2021)   PRAPARE - Hydrologist (Medical): No    Lack of Transportation (Non-Medical): No  Physical Activity: Inactive (11/10/2021)   Exercise Vital Sign    Days of Exercise per Week: 0 days    Minutes of Exercise per Session: 10 min  Stress: No Stress Concern  Present (11/10/2021)   [place]    Feeling of Stress : Not at all  Social Connections: Moderately Isolated (11/10/2021)   Social Connection and Isolation Panel [NHANES]    Frequency of Communication with Friends and Family: More than three times a week    Frequency of Social Gatherings with Friends and Family: More than three times a week    Attends Religious Services: Never    Marine scientist or Organizations: No    Attends Archivist Meetings: Never    Marital Status: Married  Human resources officer Violence: Not At Risk (11/10/2021)   Humiliation, Afraid, Rape, and  Kick questionnaire    Fear of Current or Ex-Partner: No    Emotionally Abused: No    Physically Abused: No    Sexually Abused: No    ALLERGIES: Patient has no known allergies.  MEDICATIONS:  Current Outpatient Medications  Medication Sig Dispense Refill   amLODipine (NORVASC) 5 MG tablet Take 1 tablet (5 mg total) by mouth daily. 90 tablet 1   aspirin 81 MG tablet Take by mouth daily.     fluticasone (FLONASE) 50 MCG/ACT nasal spray SPRAY 2 SPRAYS INTO EACH NOSTRIL EVERY DAY 48 mL 1   hydrochlorothiazide (MICROZIDE) 12.5 MG capsule TAKE 1 CAPSULE BY MOUTH EVERY DAY 90 capsule 1   hydrOXYzine (ATARAX/VISTARIL) 25 MG tablet Take 50 mg by mouth every 6 (six) hours as needed.     triamcinolone cream (KENALOG) 0.1 % APPLY TO AFFECTED AREA TWICE A DAY 80 g 2   No current facility-administered medications for this encounter.    REVIEW OF SYSTEMS:  On review of systems, the patient reports that he is doing well overall. He denies any chest pain, shortness of breath, cough, fevers, chills, night sweats, unintended weight changes. He denies any bowel disturbances, and denies abdominal pain, nausea or vomiting. He denies any new musculoskeletal or joint aches or pains. His IPSS was ***, indicating *** urinary symptoms. His SHIM was ***, indicating he {does not have/has mild/moderate/severe} erectile dysfunction. A complete review of systems is obtained and is otherwise negative.    PHYSICAL EXAM:  Wt Readings from Last 3 Encounters:  02/10/22 287 lb 12.8 oz (130.5 kg)  11/10/21 286 lb (129.7 kg)  03/25/21 286 lb 6.4 oz (129.9 kg)   Temp Readings from Last 3 Encounters:  02/10/22 (!) 97.1 F (36.2 C) (Temporal)  03/25/21 97.6 F (36.4 C) (Temporal)  02/07/21 97.9 F (36.6 C) (Temporal)   BP Readings from Last 3 Encounters:  02/10/22 130/80  03/25/21 125/85  02/07/21 130/85   Pulse Readings from Last 3 Encounters:  02/10/22 72  03/25/21 80  02/07/21 81    /10  Physical exam not  performed in light of telephone encounter.   KPS = ***  100 - Normal; no complaints; no evidence of disease. 90   - Able to carry on normal activity; minor signs or symptoms of disease. 80   - Normal activity with effort; some signs or symptoms of disease. 25   - Cares for self; unable to carry on normal activity or to do active work. 60   - Requires occasional assistance, but is able to care for most of his personal needs. 50   - Requires considerable assistance and frequent medical care. 70   - Disabled; requires special care and assistance. 38   - Severely disabled; hospital admission is indicated although death not imminent. 20   -  Very sick; hospital admission necessary; active supportive treatment necessary. 10   - Moribund; fatal processes progressing rapidly. 0     - Dead  Karnofsky DA, Abelmann [place], Craver LS and Burchenal [facility] [phone]) The use of the nitrogen mustards in the palliative treatment of carcinoma: with particular reference to bronchogenic carcinoma Cancer 1 [phone]  LABORATORY DATA:  Lab Results  Component Value Date   WBC 9.3 02/10/2022   HGB 16.2 02/10/2022   HCT 48.3 02/10/2022   MCV 87 02/10/2022   PLT 277 02/10/2022   Lab Results  Component Value Date   NA 140 02/10/2022   K 4.6 02/10/2022   CL 96 02/10/2022   CO2 30 (H) 02/10/2022   Lab Results  Component Value Date   ALT 11 02/10/2022   AST 18 02/10/2022   ALKPHOS 89 02/10/2022   BILITOT 0.4 02/10/2022     RADIOGRAPHY: No results found.    IMPRESSION/PLAN: This visit was conducted via Telephone to spare the patient unnecessary potential exposure in the healthcare setting during the current COVID-19 pandemic. 1. 72 y.o. gentleman with Stage T1c adenocarcinoma of the prostate with Gleason Score of 4+3, and PSA of 5.2. We discussed the patient's workup and outlined the nature of prostate cancer in this setting. The patient's T stage, Gleason's score, and PSA put him into the intermediate risk group.  Accordingly, he is eligible for a variety of potential treatment options including brachytherapy, 5.5 weeks of external radiation, or prostatectomy. We discussed the available radiation techniques, and focused on the details and logistics and delivery. We discussed and outlined the risks, benefits, short and long-term effects associated with radiotherapy and compared and contrasted these with prostatectomy. We discussed the role of SpaceOAR in reducing the rectal toxicity associated with radiotherapy. He appears to have a good understanding of his disease and our treatment recommendations which are of curative intent.  He was encouraged to ask questions that were answered to his stated satisfaction.  At the end of the conversation the patient is interested in moving forward with ***.   Given current concerns for patient exposure during the COVID-19 pandemic, this encounter was conducted via telephone. The patient was notified in advance and was offered a MyChart meeting to allow for face to face communication but unfortunately reported that he did not have the appropriate resources/technology to support such a visit and instead preferred to proceed with telephone consult. The patient has given verbal consent for this type of encounter. The time spent during this encounter was *** minutes. The attendants for this meeting include Tyler Pita MD, Ashlyn Bruning PA-C, George, and patient Ronald Pitts {and ***.} During the encounter, Tyler Pita MD, Ashlyn Bruning PA-C, and scribe, Wilburn Mylar were located at [facility] Radiation Oncology Department.  Patient Ronald Pitts {and *** were} was located at home.    Nicholos Johns, PA-C    Tyler Pita, MD  [facility] Direct Dial: [phone]  Fax: [phone] [email or web address]  Skype  LinkedIn   This document serves as a record of services personally performed by Tyler Pita, MD and Freeman Caldron, PA-C. It was created on their behalf by Wilburn Mylar, a trained medical scribe. The creation of this record is based on the scribe's personal observations and the provider's statements to them. This document has been checked and approved by the attending provider.

## 2022-05-04 ENCOUNTER — Ambulatory Visit
Admission: RE | Admit: 2022-05-04 | Discharge: 2022-05-04 | Disposition: A | Discharge status: Home / Self Care | Payer: Medicare Other | Source: Ambulatory Visit | Attending: Radiation Oncology | Admitting: Radiation Oncology

## 2022-05-04 ENCOUNTER — Other Ambulatory Visit: Payer: Self-pay

## 2022-05-04 VITALS — Ht 70.0 in | Wt 285.0 lb

## 2022-05-04 DIAGNOSIS — C61 Malignant neoplasm of prostate: Secondary | ICD-10-CM

## 2022-05-04 DIAGNOSIS — Z191 Hormone sensitive malignancy status: Secondary | ICD-10-CM | POA: Diagnosis not present

## 2022-05-05 NOTE — Progress Notes (Signed)
Spoke with patient and ntroduced myself to the patient as the prostate nurse navigator.  No barriers to care identified at this time.  He recently had a virtual RadOnc consult and has decided to proceed with radiation.  I gave him my contact number and asked him to call me with questions or concerns.  Verbalized understanding.  Plan of care in progress.

## 2022-05-05 NOTE — Progress Notes (Signed)
Patient had a RadOnc telephone consult with Dr. Tammi Klippel on 05/04/2022 and decided to proceed with 5.5 weeks of external radiation.   RN left voicemail with direct contact information to assess any navigation needs, or questions.   Plan of care in progress.

## 2022-05-17 ENCOUNTER — Other Ambulatory Visit: Payer: Self-pay | Admitting: Family Medicine

## 2022-05-17 DIAGNOSIS — R21 Rash and other nonspecific skin eruption: Secondary | ICD-10-CM

## 2022-05-18 ENCOUNTER — Telehealth: Payer: Self-pay | Admitting: *Deleted

## 2022-05-18 NOTE — Telephone Encounter (Signed)
RETURNED PATIENT'S PHONE CALL, SPOKE WITH PATIENT. ?

## 2022-05-26 ENCOUNTER — Other Ambulatory Visit: Payer: Self-pay | Admitting: Urology

## 2022-05-29 MED ORDER — FLEET ENEMA 7-19 GM/118ML RE ENEM: 1.0000 | ENEMA | Freq: Once | RECTAL | Status: AC

## 2022-06-04 DIAGNOSIS — N390 Urinary tract infection, site not specified: Secondary | ICD-10-CM | POA: Diagnosis not present

## 2022-06-16 ENCOUNTER — Encounter (HOSPITAL_BASED_OUTPATIENT_CLINIC_OR_DEPARTMENT_OTHER): Payer: Self-pay | Admitting: Urology

## 2022-06-16 NOTE — Progress Notes (Signed)
Spoke w/ via phone for pre-op interview--- pt Lab needs dos----  [facility], ekg             Lab results------ no COVID test -----patient states asymptomatic no test needed Arrive at -------  0630 on 06-19-2022 NPO after MN NO Solid Food.  Clear liquids from MN until--- 0530 Med rec completed Medications to take morning of surgery ----- norvasc Diabetic medication ----- n/a Patient instructed no nail polish to be worn day of surgery Patient instructed to bring photo id and insurance card day of surgery Patient aware to have Driver (ride ) / caregiver    for 24 hours after surgery -- wife, Ronald Pitts Patient Special Instructions ----- will do fleet enema night before surgery Pre-Op special Istructions ----- n/a Patient verbalized understanding of instructions that were given at this phone interview. Patient denies shortness of breath, chest pain, fever, cough at this phone interview.

## 2022-06-19 ENCOUNTER — Ambulatory Visit (HOSPITAL_BASED_OUTPATIENT_CLINIC_OR_DEPARTMENT_OTHER)
Admission: RE | Admit: 2022-06-19 | Discharge: 2022-06-19 | Disposition: A | Discharge status: Home / Self Care | Payer: Medicare Other | Attending: Urology | Admitting: Urology

## 2022-06-19 ENCOUNTER — Encounter (HOSPITAL_BASED_OUTPATIENT_CLINIC_OR_DEPARTMENT_OTHER)
Admission: RE | Disposition: A | Discharge status: Home / Self Care | Payer: Self-pay | Source: Home / Self Care | Attending: Urology

## 2022-06-19 ENCOUNTER — Ambulatory Visit (HOSPITAL_BASED_OUTPATIENT_CLINIC_OR_DEPARTMENT_OTHER): Payer: Medicare Other | Admitting: Anesthesiology

## 2022-06-19 ENCOUNTER — Other Ambulatory Visit: Payer: Self-pay

## 2022-06-19 ENCOUNTER — Encounter (HOSPITAL_BASED_OUTPATIENT_CLINIC_OR_DEPARTMENT_OTHER): Payer: Self-pay | Admitting: Urology

## 2022-06-19 DIAGNOSIS — I10 Essential (primary) hypertension: Secondary | ICD-10-CM

## 2022-06-19 DIAGNOSIS — Z79899 Other long term (current) drug therapy: Secondary | ICD-10-CM | POA: Diagnosis not present

## 2022-06-19 DIAGNOSIS — Z6841 Body Mass Index (BMI) 40.0 and over, adult: Secondary | ICD-10-CM | POA: Insufficient documentation

## 2022-06-19 DIAGNOSIS — M1990 Unspecified osteoarthritis, unspecified site: Secondary | ICD-10-CM | POA: Diagnosis not present

## 2022-06-19 DIAGNOSIS — E119 Type 2 diabetes mellitus without complications: Secondary | ICD-10-CM | POA: Diagnosis not present

## 2022-06-19 DIAGNOSIS — Z87891 Personal history of nicotine dependence: Secondary | ICD-10-CM | POA: Diagnosis not present

## 2022-06-19 DIAGNOSIS — Z01818 Encounter for other preprocedural examination: Secondary | ICD-10-CM

## 2022-06-19 DIAGNOSIS — C61 Malignant neoplasm of prostate: Secondary | ICD-10-CM | POA: Diagnosis present

## 2022-06-19 DIAGNOSIS — R7303 Prediabetes: Secondary | ICD-10-CM | POA: Diagnosis not present

## 2022-06-19 HISTORY — DX: Prediabetes: R73.03

## 2022-06-19 HISTORY — DX: Presence of dental prosthetic device (complete) (partial): Z97.2

## 2022-06-19 HISTORY — PX: SPACE OAR INSTILLATION: SHX6769

## 2022-06-19 HISTORY — DX: Unspecified osteoarthritis, unspecified site: M19.90

## 2022-06-19 HISTORY — PX: GOLD SEED IMPLANT: SHX6343

## 2022-06-19 HISTORY — DX: Personal history of adenomatous and serrated colon polyps: Z86.0101

## 2022-06-19 HISTORY — DX: Personal history of colonic polyps: Z86.010

## 2022-06-19 LAB — POCT I-STAT, CHEM 8
BUN: 24 mg/dL — ABNORMAL HIGH (ref 8–23)
Calcium, Ion: 1.15 mmol/L (ref 1.15–1.40)
Chloride: 95 mmol/L — ABNORMAL LOW (ref 98–111)
Creatinine, Ser: 0.9 mg/dL (ref 0.61–1.24)
Glucose, Bld: 157 mg/dL — ABNORMAL HIGH (ref 70–99)
HCT: 49 % (ref 39.0–52.0)
Hemoglobin: 16.7 g/dL (ref 13.0–17.0)
Potassium: 3.9 mmol/L (ref 3.5–5.1)
Sodium: 139 mmol/L (ref 135–145)
TCO2: 32 mmol/L (ref 22–32)

## 2022-06-19 SURGERY — INSERTION, GOLD SEEDS
Anesthesia: Monitor Anesthesia Care | Site: Prostate

## 2022-06-19 MED ORDER — FENTANYL CITRATE (PF) 100 MCG/2ML IJ SOLN: 25.0000 ug | INTRAMUSCULAR | Status: DC | PRN

## 2022-06-19 MED ORDER — PROPOFOL 10 MG/ML IV BOLUS
INTRAVENOUS | Status: DC | PRN
  Administered 2022-06-19: 20 mg via INTRAVENOUS

## 2022-06-19 MED ORDER — PROPOFOL 500 MG/50ML IV EMUL
INTRAVENOUS | Status: DC | PRN
  Administered 2022-06-19: 125 ug/kg/min via INTRAVENOUS

## 2022-06-19 MED ORDER — BUPIVACAINE HCL (PF) 0.25 % IJ SOLN
INTRAMUSCULAR | Status: DC | PRN
  Administered 2022-06-19: 10 mL

## 2022-06-19 MED ORDER — PROPOFOL 500 MG/50ML IV EMUL
INTRAVENOUS | Status: AC
  Filled 2022-06-19: qty 50

## 2022-06-19 MED ORDER — LIDOCAINE HCL (PF) 2 % IJ SOLN
INTRAMUSCULAR | Status: AC
  Filled 2022-06-19: qty 5

## 2022-06-19 MED ORDER — ACETAMINOPHEN 500 MG PO TABS
1000.0000 mg | ORAL_TABLET | Freq: Once | ORAL | Status: AC
  Administered 2022-06-19: 1000 mg via ORAL

## 2022-06-19 MED ORDER — OXYCODONE HCL 5 MG PO TABS: 5.0000 mg | ORAL_TABLET | Freq: Once | ORAL | Status: DC | PRN

## 2022-06-19 MED ORDER — SODIUM CHLORIDE (PF) 0.9 % IJ SOLN
INTRAMUSCULAR | Status: DC | PRN
  Administered 2022-06-19: 10 mL

## 2022-06-19 MED ORDER — LACTATED RINGERS IV SOLN: INTRAVENOUS | Status: DC

## 2022-06-19 MED ORDER — AMISULPRIDE (ANTIEMETIC) 5 MG/2ML IV SOLN: 10.0000 mg | Freq: Once | INTRAVENOUS | Status: DC | PRN

## 2022-06-19 MED ORDER — OXYCODONE HCL 5 MG/5ML PO SOLN: 5.0000 mg | Freq: Once | ORAL | Status: DC | PRN

## 2022-06-19 MED ORDER — CEFAZOLIN SODIUM-DEXTROSE 2-4 GM/100ML-% IV SOLN
INTRAVENOUS | Status: AC
  Filled 2022-06-19: qty 100

## 2022-06-19 MED ORDER — ONDANSETRON HCL 4 MG/2ML IJ SOLN
INTRAMUSCULAR | Status: AC
  Filled 2022-06-19: qty 2

## 2022-06-19 MED ORDER — ONDANSETRON HCL 4 MG/2ML IJ SOLN
INTRAMUSCULAR | Status: DC | PRN
  Administered 2022-06-19: 4 mg via INTRAVENOUS

## 2022-06-19 MED ORDER — ONDANSETRON HCL 4 MG/2ML IJ SOLN: 4.0000 mg | Freq: Once | INTRAMUSCULAR | Status: DC | PRN

## 2022-06-19 MED ORDER — LIDOCAINE 2% (20 MG/ML) 5 ML SYRINGE
INTRAMUSCULAR | Status: DC | PRN
  Administered 2022-06-19: 60 mg via INTRAVENOUS

## 2022-06-19 MED ORDER — CEFAZOLIN SODIUM-DEXTROSE 2-4 GM/100ML-% IV SOLN
2.0000 g | INTRAVENOUS | Status: AC
  Administered 2022-06-19: 1 g via INTRAVENOUS
  Administered 2022-06-19: 2 g via INTRAVENOUS

## 2022-06-19 MED ORDER — ACETAMINOPHEN 500 MG PO TABS
ORAL_TABLET | ORAL | Status: AC
  Filled 2022-06-19: qty 2

## 2022-06-19 SURGICAL SUPPLY — 26 items
BLADE CLIPPER SENSICLIP SURGIC (BLADE) ×1 IMPLANT
CNTNR URN SCR LID CUP LEK RST (MISCELLANEOUS) ×1 IMPLANT
CONT SPEC 4OZ STRL OR WHT (MISCELLANEOUS) ×1
COVER BACK TABLE 60X90IN (DRAPES) ×1 IMPLANT
DRSG TEGADERM 4X4.75 (GAUZE/BANDAGES/DRESSINGS) ×1 IMPLANT
DRSG TEGADERM 8X12 (GAUZE/BANDAGES/DRESSINGS) ×1 IMPLANT
GAUZE SPONGE 4X4 12PLY STRL (GAUZE/BANDAGES/DRESSINGS) ×1 IMPLANT
GLOVE BIO SURGEON STRL SZ7.5 (GLOVE) ×1 IMPLANT
GLOVE ECLIPSE 8.0 STRL XLNG CF (GLOVE) ×1 IMPLANT
GLOVE SURG ORTHO 8.5 STRL (GLOVE) ×1 IMPLANT
IMPL SPACEOAR VUE SYSTEM (Spacer) ×1 IMPLANT
IMPLANT SPACEOAR VUE SYSTEM (Spacer) ×1 IMPLANT
KIT TURNOVER CYSTO (KITS) ×1 IMPLANT
MARKER GOLD PRELOAD 1.2X3 (Urological Implant) ×1 IMPLANT
MARKER SKIN DUAL TIP RULER LAB (MISCELLANEOUS) ×1 IMPLANT
NDL SPNL 22GX3.5 QUINCKE BK (NEEDLE) ×1 IMPLANT
NEEDLE SPNL 22GX3.5 QUINCKE BK (NEEDLE) ×1 IMPLANT
SEED GOLD PRELOAD 1.2X3 (Urological Implant) ×1 IMPLANT
SHEATH ULTRASOUND LF (SHEATH) IMPLANT
SHEATH ULTRASOUND LTX NONSTRL (SHEATH) IMPLANT
SLEEVE SCD COMPRESS KNEE MED (STOCKING) ×1 IMPLANT
SURGILUBE 2OZ TUBE FLIPTOP (MISCELLANEOUS) ×1 IMPLANT
SYR 10ML LL (SYRINGE) ×1 IMPLANT
SYR CONTROL 10ML LL (SYRINGE) ×1 IMPLANT
TOWEL OR 17X24 6PK STRL BLUE (TOWEL DISPOSABLE) ×1 IMPLANT
UNDERPAD 30X36 HEAVY ABSORB (UNDERPADS AND DIAPERS) ×1 IMPLANT

## 2022-06-19 NOTE — Anesthesia Preprocedure Evaluation (Addendum)
Anesthesia Evaluation  Patient identified by MRN, date of birth, ID band Patient awake    Reviewed: Allergy & Precautions, H&P , NPO status , Patient's Chart, lab work & pertinent test results  Airway Mallampati: IV  TM Distance: >3 FB Neck ROM: Full  Mouth opening: Limited Mouth Opening Comment: Small mouth opening, large tongue, large neck Dental no notable dental hx. (+) Partial Upper, Partial Lower   Pulmonary former smoker Has not been tested for OSA, high likelihood   Pulmonary exam normal breath sounds clear to auscultation       Cardiovascular hypertension, Pt. on medications Normal cardiovascular exam Rhythm:Regular Rate:Normal     Neuro/Psych negative neurological ROS  negative psych ROS   GI/Hepatic negative GI ROS, Neg liver ROS,,,  Endo/Other  diabetes  Morbid obesityBMI 42 Per pt pre-diabetic, fasting FS 157 this AM  Renal/GU negative Renal ROS  negative genitourinary   Musculoskeletal  (+) Arthritis , Osteoarthritis,    Abdominal  (+) + obese  Peds negative pediatric ROS (+)  Hematology negative hematology ROS (+)   Anesthesia Other Findings   Reproductive/Obstetrics negative OB ROS                             Anesthesia Physical Anesthesia Plan  ASA: 3  Anesthesia Plan: MAC   Post-op Pain Management: Tylenol PO (pre-op)*   Induction:   PONV Risk Score and Plan: 2 and Propofol infusion and TIVA  Airway Management Planned: Natural Airway and Simple Face Mask  Additional Equipment: None  Intra-op Plan:   Post-operative Plan:   Informed Consent: I have reviewed the patients History and Physical, chart, labs and discussed the procedure including the risks, benefits and alternatives for the proposed anesthesia with the patient or authorized representative who has indicated his/her understanding and acceptance.       Plan Discussed with: CRNA  Anesthesia  Plan Comments: (May be difficult to sedate given size and airway, possible LMA)       Anesthesia Quick Evaluation

## 2022-06-19 NOTE — Transfer of Care (Signed)
Immediate Anesthesia Transfer of Care Note  Patient: Ronald Pitts  Procedure(s) Performed: GOLD SEED IMPLANT (Prostate) SPACE OAR INSTILLATION (Prostate)  Patient Location: PACU  Anesthesia Type:MAC  Level of Consciousness: awake, alert , and oriented  Airway & Oxygen Therapy: Patient Spontanous Breathing and Patient connected to face mask oxygen  Post-op Assessment: Report given to RN and Post -op Vital signs reviewed and stable  Post vital signs: Reviewed and stable  Last Vitals:  Vitals Value Taken Time  BP    Temp    Pulse 78 06/19/22 0936  Resp    SpO2 100 % 06/19/22 0936    Last Pain:  Vitals:   06/19/22 0649  TempSrc: Oral         Complications: No notable events documented.

## 2022-06-19 NOTE — Discharge Instructions (Signed)
  Post Anesthesia Home Care Instructions  Activity: Get plenty of rest for the remainder of the day. A responsible individual must stay with you for 24 hours following the procedure.  For the next 24 hours, DO NOT: -Drive a car -Paediatric nurse -Drink alcoholic beverages -Take any medication unless instructed by your physician -Make any legal decisions or sign important papers.  Meals: Start with liquid foods such as gelatin or soup. Progress to regular foods as tolerated. Avoid greasy, spicy, heavy foods. If nausea and/or vomiting occur, drink only clear liquids until the nausea and/or vomiting subsides. Call your physician if vomiting continues.  Special Instructions/Symptoms: Your throat may feel dry or sore from the anesthesia or the breathing tube placed in your throat during surgery. If this causes discomfort, gargle with warm salt water. The discomfort should disappear within 24 hours.    No acetaminophen/Tylenol until after 1:06 pm today if needed.

## 2022-06-19 NOTE — H&P (Signed)
Office Visit Report     06/04/2022   --------------------------------------------------------------------------------   Ronald Pitts  MRN: [id number]  DOB: 05/01/1950, 72 year old Male  SSN:    PRIMARY CARE:  Darla Lesches, NP  PRIMARY CARE FAX:  [phone]  REFERRING:  Johny Chess. Claudia Desanctis, MD  PROVIDER:  Jacalyn Lefevre, M.D.  TREATING:  Jiles Crocker, NP  LOCATION:  [facility] [phone]     --------------------------------------------------------------------------------   CC/HPI: cc: prostate cancer    03/04/22: 72 year old man referred for an elevated PSA of 5.2. Patient does not have bothersome lower urinary tract symptoms or family history of prostate cancer. He denies any gross hematuria.   IPSS: 1, 0, 0, 0, 1, 0, 2=4/35  Quality of life mostly satisfied 2/6    04/08/22: Here for TRUS prostate biopsy. He has taken levaquin and is not taking any blood thinners.    04/15/22: 72 year old man with an elevated PSA underwent a TRUS prostate biopsy last week. He is here today for pathology results. He has recovered well from the biopsy.   pre-biopsy PSA 5.2  TRUS prostate bx 04/08/22  Gleason 4+3=7 in 2/12 cores and Gleason 3+4=7 in 3/12 cores   MSK nomogram  59yrCSS 94%  PFS 10 yr- 419 5 yr - 688 45/55/11/11   06/04/2022: Patient with above-noted history. He has since met with radiation oncology and elected to proceed with 5.5 weeks of XRT. He is scheduled for fiducial marker placement and SpaceOAR insertion on 2/23. Here today for preoperative appointment. Overall doing well. Denies any changes in past medical history, prescription medications taken on daily basis, no interval surgical or procedural intervention. Continues to void at his baseline with no new or worsening symptomology including absence of any dysuria or gross hematuria. He denies any recent fevers or chills, nausea/vomiting, chest pain, shortness of breath.     ALLERGIES: No Known  Allergies    MEDICATIONS: Hydrochlorothiazide 12.5 mg tablet  Amlodipine Besylate 5 mg tablet  Aspirin Ec 81 mg tablet, delayed release  Fluticasone Propionate  Triamcinolone Acetonide 0.1 % cream     GU PSH: Prostate Needle Biopsy - 04/08/2022     NON-GU PSH: Colonoscopy Hand/finger Surgery Surgical Pathology, Gross And Microscopic Examination For Prostate Needle - 04/08/2022     GU PMH: Elevated PSA - 04/15/2022, - 04/08/2022, - 03/04/2022 Prostate Cancer - 04/15/2022      PMH Notes: Pre diabetes.  Personal history of colonic polyps.     NON-GU PMH: Hypertension    FAMILY HISTORY: 1 Daughter - Other 1 son - Other   SOCIAL HISTORY: Marital Status: Married Preferred Language: English; Ethnicity: Not Hispanic Or Latino; Race: White Current Smoking Status: Patient does not smoke anymore.   Tobacco Use Assessment Completed: Used Tobacco in last 30 days? Does drink.  Drinks 2 caffeinated drinks per day.    REVIEW OF SYSTEMS:    GU Review Male:   Patient reports get up at night to urinate. Patient denies frequent urination, hard to postpone urination, burning/ pain with urination, leakage of urine, stream starts and stops, trouble starting your stream, have to strain to urinate , erection problems, and penile pain.  Gastrointestinal (Upper):   Patient denies vomiting, indigestion/ heartburn, and nausea.  Gastrointestinal (Lower):   Patient denies diarrhea and constipation.  Constitutional:   Patient denies fever, night sweats, weight loss, and fatigue.  Skin:   Patient denies skin rash/ lesion and itching.  Eyes:   Patient denies blurred vision  and double vision.  Ears/ Nose/ Throat:   Patient denies sore throat and sinus problems.  Hematologic/Lymphatic:   Patient denies swollen glands and easy bruising.  Cardiovascular:   Patient denies leg swelling and chest pains.  Respiratory:   Patient denies cough and shortness of breath.  Endocrine:   Patient denies excessive  thirst.  Musculoskeletal:   Patient denies back pain and joint pain.  Neurological:   Patient denies headaches and dizziness.  Psychologic:   Patient denies depression and anxiety.   Notes: Reviewed previous review of systems 03/04/2022. No changes.   VITAL SIGNS:      06/04/2022 09:43 AM  Weight 285 lb / 129.27 kg  BP 145/79 mmHg  Pulse 79 /min  Temperature 97.6 F / 36.4 C   MULTI-SYSTEM PHYSICAL EXAMINATION:    Constitutional: Well-nourished. No physical deformities. Normally developed. Good grooming.  Neck: Neck symmetrical, not swollen. Normal tracheal position.  Respiratory: No labored breathing, no use of accessory muscles.   Cardiovascular: Normal temperature, normal extremity pulses, no swelling, no varicosities.  Skin: No paleness, no jaundice, no cyanosis. No lesion, no ulcer, no rash.  Neurologic / Psychiatric: Oriented to time, oriented to place, oriented to person. No depression, no anxiety, no agitation.  Gastrointestinal: Obese abdomen. No mass, no tenderness, no rigidity.   Musculoskeletal: Normal gait and station of head and neck.     Complexity of Data:  Source Of History:  Patient, Medical Record Summary  Lab Test Review:   PSA  Records Review:   Pathology Reports, Previous Doctor Records, Previous Hospital Records, Previous Patient Records  Urine Test Review:   Urinalysis   03/04/22  PSA  Total PSA 4.84 ng/mL  Free PSA 0.69 ng/mL  % Free PSA 14 % PSA    06/04/22  Urinalysis  Urine Appearance Clear   Urine Color Yellow   Urine Glucose Neg mg/dL  Urine Bilirubin Neg mg/dL  Urine Ketones Neg mg/dL  Urine Specific Gravity 1.025   Urine Blood Neg ery/uL  Urine pH 6.0   Urine Protein Neg mg/dL  Urine Urobilinogen 0.2 mg/dL  Urine Nitrites Neg   Urine Leukocyte Esterase Neg leu/uL   PROCEDURES:          Urinalysis Dipstick Dipstick Cont'd  Color: Yellow Bilirubin: Neg mg/dL  Appearance: Clear Ketones: Neg mg/dL  Specific Gravity: 1.025 Blood: Neg  ery/uL  pH: 6.0 Protein: Neg mg/dL  Glucose: Neg mg/dL Urobilinogen: 0.2 mg/dL    Nitrites: Neg    Leukocyte Esterase: Neg leu/uL    ASSESSMENT:      ICD-10 Details  1 GU:   Prostate Cancer - C61 Chronic, Threat to Bodily Function  2 NON-GU:   Encounter for other preprocedural examination - Z01.818 Undiagnosed New Problem   PLAN:           Orders Labs Urine Culture          Schedule Return Visit/Planned Activity: Keep Scheduled Appointment - Follow up MD, Schedule Surgery          Document Letter(s):  Created for Patient: Clinical Summary         Notes:   All questions answered to the best my ability regarding the upcoming procedure and expected postoperative course with understanding expressed by the patient. Urine culture sent today to serve as a baseline. He will proceed with previously scheduled fiducial marker placement and SpaceOAR on 2/23.        Next Appointment:      Next Appointment: 06/19/2022 10:30  AM    Appointment Type: Surgery     Location: [facility] [phone]    Provider: Rexene Alberts, M.D.    Reason for Visit: NE/OP FIDUCIAL [place]    Urology Preoperative H&P   Chief Complaint: Prostate cancer  History of Present Illness: Ronald Pitts is a 72 y.o. male with prostate cancer here for fiducial marker placement and Space OAR. Denies fevers, chills, dysuria.    Past Medical History:  Diagnosis Date   History of adenomatous polyp of colon    Hypertension    Malignant neoplasm prostate [facility]) 03/2022   urologist--- dr pace/  radiation oncologsit--- dr Tammi Klippel---  dx 12/ 2023,  Gleason 4+3   OA (osteoarthritis)    Pre-diabetes    Wears partial dentures    upper and lower    Past Surgical History:  Procedure Laterality Date   COLONOSCOPY W/ POLYPECTOMY  05/08/2015   dr Carlean Purl   FINGER SURGERY Right 1970   index  ,  repair amputation injury    Allergies: No Known Allergies  Family History  Problem Relation  Age of Onset   Colon cancer Neg Hx     Social History:  reports that he quit smoking about 25 years ago. His smoking use included cigarettes. He has never used smokeless tobacco. He reports that he does not currently use alcohol. He reports that he does not use drugs.  ROS: A complete review of systems was performed.  All systems are negative except for pertinent findings as noted.  Physical Exam:  Vital signs in last 24 hours: Temp:  [97.5 F (36.4 C)] 97.5 F (36.4 C) (02/23 0649) Pulse Rate:  [86] 86 (02/23 0649) Resp:  [18] 18 (02/23 0649) BP: (139)/(72) 139/72 (02/23 0649) SpO2:  [96 %] 96 % (02/23 0649) Weight:  [131.7 kg] 131.7 kg (02/23 0649) Constitutional:  Alert and oriented, No acute distress Cardiovascular: Regular rate and rhythm Respiratory: Normal respiratory effort, Lungs clear bilaterally GI: Abdomen is soft, nontender, nondistended, no abdominal masses GU: No CVA tenderness Lymphatic: No lymphadenopathy Neurologic: Grossly intact, no focal deficits Psychiatric: Normal mood and affect  Laboratory Data:  Recent Labs    06/19/22 0703  HGB 16.7  HCT 49.0    Recent Labs    06/19/22 0703  NA 139  K 3.9  CL 95*  GLUCOSE 157*  BUN 24*  CREATININE 0.90     Results for orders placed or performed during the hospital encounter of 06/19/22 (from the past 24 hour(s))  I-STAT, chem 8     Status: Abnormal   Collection Time: 06/19/22  7:03 AM  Result Value Ref Range   Sodium 139 135 - 145 mmol/L   Potassium 3.9 3.5 - 5.1 mmol/L   Chloride 95 (L) 98 - 111 mmol/L   BUN 24 (H) 8 - 23 mg/dL   Creatinine, Ser 0.90 0.61 - 1.24 mg/dL   Glucose, Bld 157 (H) 70 - 99 mg/dL   Calcium, Ion 1.15 1.15 - 1.40 mmol/L   TCO2 32 22 - 32 mmol/L   Hemoglobin 16.7 13.0 - 17.0 g/dL   HCT 49.0 39.0 - 52.0 %   No results found for this or any previous visit (from the past 240 hour(s)).  Renal Function: Recent Labs    06/19/22 0703  CREATININE 0.90   Estimated  Creatinine Clearance: 99.1 mL/min (by C-G formula based on SCr of 0.9 mg/dL).  Radiologic Imaging: No results found.  I independently reviewed the  above imaging studies.  Assessment and Plan Ronald Pitts is a 72 y.o. male with fiducial marker placement and Space OAR.   Ronald R. Aryaan Persichetti MD 06/19/2022, 8:42 AM  [facility] Pager: [phone]

## 2022-06-19 NOTE — Op Note (Signed)
Operative Note  Preoperative diagnosis:  1.  Clinically localized adenocarcinoma of the prostate   Postoperative diagnosis: 1.  Clinically localized adenocarcinoma of the prostate  Procedure(s): 1. Placement of fiducial markers into prostate 2. Insertion of SpaceOAR hydrogel   Surgeon: Rexene Alberts, MD  Assistants:  None  Anesthesia:  General  Complications:  None  EBL:  Minimal  Specimens: 1. None  Drains/Catheters: 1.  None  Indication:  Ronald Pitts is a 72 y.o. male with clinically localized prostate cancer. After discussing management options for treatment, he elected to proceed with radiotherapy. He presents today for the above procedures. The potential risks, complications, alternative options, and expected recovery course have been discussed in detail with the patient and he has provided informed consent to proceed.  Description of procedure: The patient was administered preoperative antibiotics, placed in the dorsal lithotomy position, and prepped and draped in the usual sterile fashion. Next, transrectal ultrasonography was utilized to visualize the prostate. Three gold fiducial markers were then placed into the prostate via transperineal needles under ultrasound guidance at the right apex, right base, and left mid gland under direct ultrasound guidance. A site in the midline was then selected on the perineum for placement of an 18 g needle with saline. The needle was advanced above the rectum and below Denonvillier's fascia to the mid gland and confirmed to be in the midline on transverse imaging. One cc of saline was injected confirming appropriate expansion of this space. A total of 5 cc of saline was then injected to open the space further bilaterally. The saline syringe was then removed and the SpaceOAR hydrogel was injected with good distribution bilaterally. He tolerated the procedure well and without complications. He was given a voiding trial prior to discharge from  the PACU.  [facility]  Pager: [phone]

## 2022-06-19 NOTE — Anesthesia Postprocedure Evaluation (Signed)
Anesthesia Post Note  Patient: Ronald Pitts  Procedure(s) Performed: GOLD SEED IMPLANT (Prostate) SPACE OAR INSTILLATION (Prostate)     Patient location during evaluation: PACU Anesthesia Type: MAC Level of consciousness: awake and alert Pain management: pain level controlled Vital Signs Assessment: post-procedure vital signs reviewed and stable Respiratory status: spontaneous breathing, nonlabored ventilation and respiratory function stable Cardiovascular status: blood pressure returned to baseline and stable Postop Assessment: no apparent nausea or vomiting Anesthetic complications: no   No notable events documented.  Last Vitals:  Vitals:   06/19/22 0945 06/19/22 0955  BP: 116/76 126/77  Pulse: 74 72  Resp: 14 18  Temp:  36.7 C  SpO2: 95% 94%    Last Pain:  Vitals:   06/19/22 0955  TempSrc:   PainSc: 0-No pain                 Pervis Hocking

## 2022-06-22 ENCOUNTER — Encounter (HOSPITAL_BASED_OUTPATIENT_CLINIC_OR_DEPARTMENT_OTHER): Payer: Self-pay | Admitting: Urology

## 2022-06-28 DIAGNOSIS — Z191 Hormone sensitive malignancy status: Secondary | ICD-10-CM | POA: Diagnosis not present

## 2022-06-28 NOTE — Progress Notes (Signed)
  Radiation Oncology         [phone] ________________________________  Name: Ronald Pitts MRN: [id number]  Date: 07/02/2022  DOB: 01-May-1950  SIMULATION AND TREATMENT PLANNING NOTE    ICD-10-CM   1. Malignant neoplasm of prostate ([place])  C61       DIAGNOSIS:   72 y.o. gentleman with Stage T1c adenocarcinoma of the prostate with Gleason score of 4+3, and PSA of 4.8.  NARRATIVE:  The patient was brought to the [place].  Identity was confirmed.  All relevant records and images related to the planned course of therapy were reviewed.  The patient freely provided informed written consent to proceed with treatment after reviewing the details related to the planned course of therapy. The consent form was witnessed and verified by the simulation staff.  Then, the patient was set-up in a stable reproducible supine position for radiation therapy.  A vacuum lock pillow device was custom fabricated to position his legs in a reproducible immobilized position.  Then, supervised the performance of a urethrogram under sterile conditions to identify the prostatic apex.  CT images were obtained.  Surface markings were placed.  The CT images were loaded into the planning software.  Then the prostate target and avoidance structures including the rectum, bladder, bowel and hips were contoured.  Treatment planning then occurred.  The radiation prescription was entered and confirmed.  A total of one complex treatment devices was fabricated. I have requested : Intensity Modulated Radiotherapy (IMRT) is medically necessary for this case for the following reason:  Rectal sparing.  I have requested daily cone beam CT volumetric image gudiance to track gold fiducial posiitoning along with bladder and rectal filling, this is medically necessary to assure accurate positioning of high dose radiation.  PLAN:  The patient will receive 70 Gy in 28 fractions.  ________________________________  Sheral Apley  Tammi Klippel, M.D.

## 2022-06-30 ENCOUNTER — Telehealth: Payer: Self-pay | Admitting: *Deleted

## 2022-06-30 NOTE — Telephone Encounter (Signed)
CALLED PATIENT TO REMIND OF SIM APPT. FOR 07-02-22- ARRIVAL TIME- 10:45 AM @ [facility], INFORMED PATIENT TO ARRIVE WITH A FULL BLADDER, SPOKE WITH PATIENT AND HE IS AWARE OF THIS APPT. AND THE INSTRUCTIONS

## 2022-07-02 ENCOUNTER — Other Ambulatory Visit: Payer: Self-pay

## 2022-07-02 ENCOUNTER — Ambulatory Visit
Admission: RE | Admit: 2022-07-02 | Discharge: 2022-07-02 | Disposition: A | Discharge status: Home / Self Care | Payer: Medicare Other | Source: Ambulatory Visit | Attending: Radiation Oncology | Admitting: Radiation Oncology

## 2022-07-02 DIAGNOSIS — Z510 Encounter for antineoplastic radiation therapy: Secondary | ICD-10-CM | POA: Diagnosis not present

## 2022-07-02 DIAGNOSIS — Z191 Hormone sensitive malignancy status: Secondary | ICD-10-CM | POA: Diagnosis not present

## 2022-07-02 DIAGNOSIS — C61 Malignant neoplasm of prostate: Secondary | ICD-10-CM | POA: Diagnosis present

## 2022-07-13 DIAGNOSIS — Z191 Hormone sensitive malignancy status: Secondary | ICD-10-CM | POA: Diagnosis not present

## 2022-07-13 DIAGNOSIS — Z510 Encounter for antineoplastic radiation therapy: Secondary | ICD-10-CM | POA: Diagnosis not present

## 2022-07-20 ENCOUNTER — Other Ambulatory Visit: Payer: Self-pay

## 2022-07-20 ENCOUNTER — Ambulatory Visit
Admission: RE | Admit: 2022-07-20 | Discharge: 2022-07-20 | Disposition: A | Discharge status: Home / Self Care | Payer: Medicare Other | Source: Ambulatory Visit | Attending: Radiation Oncology | Admitting: Radiation Oncology

## 2022-07-20 DIAGNOSIS — Z510 Encounter for antineoplastic radiation therapy: Secondary | ICD-10-CM | POA: Diagnosis not present

## 2022-07-20 DIAGNOSIS — Z191 Hormone sensitive malignancy status: Secondary | ICD-10-CM | POA: Diagnosis not present

## 2022-07-20 LAB — RAD ONC ARIA SESSION SUMMARY
Course Elapsed Days: 0
Plan Fractions Treated to Date: 1
Plan Prescribed Dose Per Fraction: 2.5 Gy
Plan Total Fractions Prescribed: 28
Plan Total Prescribed Dose: 70 Gy
Reference Point Dosage Given to Date: 2.5 Gy
Reference Point Session Dosage Given: 2.5 Gy
Session Number: 1

## 2022-07-21 ENCOUNTER — Other Ambulatory Visit: Payer: Self-pay

## 2022-07-21 ENCOUNTER — Ambulatory Visit
Admission: RE | Admit: 2022-07-21 | Discharge: 2022-07-21 | Disposition: A | Discharge status: Home / Self Care | Payer: Medicare Other | Source: Ambulatory Visit | Attending: Radiation Oncology | Admitting: Radiation Oncology

## 2022-07-21 DIAGNOSIS — Z510 Encounter for antineoplastic radiation therapy: Secondary | ICD-10-CM | POA: Diagnosis not present

## 2022-07-21 DIAGNOSIS — Z191 Hormone sensitive malignancy status: Secondary | ICD-10-CM | POA: Diagnosis not present

## 2022-07-21 LAB — RAD ONC ARIA SESSION SUMMARY
Course Elapsed Days: 1
Plan Fractions Treated to Date: 2
Plan Prescribed Dose Per Fraction: 2.5 Gy
Plan Total Fractions Prescribed: 28
Plan Total Prescribed Dose: 70 Gy
Reference Point Dosage Given to Date: 5 Gy
Reference Point Session Dosage Given: 2.5 Gy
Session Number: 2

## 2022-07-22 ENCOUNTER — Ambulatory Visit
Admission: RE | Admit: 2022-07-22 | Discharge: 2022-07-22 | Disposition: A | Discharge status: Home / Self Care | Payer: Medicare Other | Source: Ambulatory Visit | Attending: Radiation Oncology | Admitting: Radiation Oncology

## 2022-07-22 ENCOUNTER — Other Ambulatory Visit: Payer: Self-pay

## 2022-07-22 DIAGNOSIS — Z191 Hormone sensitive malignancy status: Secondary | ICD-10-CM | POA: Diagnosis not present

## 2022-07-22 DIAGNOSIS — Z510 Encounter for antineoplastic radiation therapy: Secondary | ICD-10-CM | POA: Diagnosis not present

## 2022-07-22 LAB — RAD ONC ARIA SESSION SUMMARY
Course Elapsed Days: 2
Plan Fractions Treated to Date: 3
Plan Prescribed Dose Per Fraction: 2.5 Gy
Plan Total Fractions Prescribed: 28
Plan Total Prescribed Dose: 70 Gy
Reference Point Dosage Given to Date: 7.5 Gy
Reference Point Session Dosage Given: 2.5 Gy
Session Number: 3

## 2022-07-23 ENCOUNTER — Ambulatory Visit
Admission: RE | Admit: 2022-07-23 | Discharge: 2022-07-23 | Disposition: A | Discharge status: Home / Self Care | Payer: Medicare Other | Source: Ambulatory Visit | Attending: Radiation Oncology | Admitting: Radiation Oncology

## 2022-07-23 ENCOUNTER — Other Ambulatory Visit: Payer: Self-pay

## 2022-07-23 DIAGNOSIS — Z510 Encounter for antineoplastic radiation therapy: Secondary | ICD-10-CM | POA: Diagnosis not present

## 2022-07-23 DIAGNOSIS — Z191 Hormone sensitive malignancy status: Secondary | ICD-10-CM | POA: Diagnosis not present

## 2022-07-23 LAB — RAD ONC ARIA SESSION SUMMARY
Course Elapsed Days: 3
Plan Fractions Treated to Date: 4
Plan Prescribed Dose Per Fraction: 2.5 Gy
Plan Total Fractions Prescribed: 28
Plan Total Prescribed Dose: 70 Gy
Reference Point Dosage Given to Date: 10 Gy
Reference Point Session Dosage Given: 2.5 Gy
Session Number: 4

## 2022-07-24 ENCOUNTER — Other Ambulatory Visit: Payer: Self-pay

## 2022-07-24 ENCOUNTER — Ambulatory Visit
Admission: RE | Admit: 2022-07-24 | Discharge: 2022-07-24 | Disposition: A | Discharge status: Home / Self Care | Payer: Medicare Other | Source: Ambulatory Visit | Attending: Radiation Oncology | Admitting: Radiation Oncology

## 2022-07-24 DIAGNOSIS — Z191 Hormone sensitive malignancy status: Secondary | ICD-10-CM | POA: Diagnosis not present

## 2022-07-24 DIAGNOSIS — Z510 Encounter for antineoplastic radiation therapy: Secondary | ICD-10-CM | POA: Diagnosis not present

## 2022-07-24 LAB — RAD ONC ARIA SESSION SUMMARY
Course Elapsed Days: 4
Plan Fractions Treated to Date: 5
Plan Prescribed Dose Per Fraction: 2.5 Gy
Plan Total Fractions Prescribed: 28
Plan Total Prescribed Dose: 70 Gy
Reference Point Dosage Given to Date: 12.5 Gy
Reference Point Session Dosage Given: 2.5 Gy
Session Number: 5

## 2022-07-27 ENCOUNTER — Ambulatory Visit
Admission: RE | Admit: 2022-07-27 | Discharge: 2022-07-27 | Disposition: A | Discharge status: Home / Self Care | Payer: Medicare Other | Source: Ambulatory Visit | Attending: Radiation Oncology | Admitting: Radiation Oncology

## 2022-07-27 ENCOUNTER — Other Ambulatory Visit: Payer: Self-pay

## 2022-07-27 DIAGNOSIS — C61 Malignant neoplasm of prostate: Secondary | ICD-10-CM | POA: Diagnosis present

## 2022-07-27 DIAGNOSIS — Z510 Encounter for antineoplastic radiation therapy: Secondary | ICD-10-CM | POA: Insufficient documentation

## 2022-07-27 DIAGNOSIS — Z191 Hormone sensitive malignancy status: Secondary | ICD-10-CM | POA: Diagnosis not present

## 2022-07-27 LAB — RAD ONC ARIA SESSION SUMMARY
Course Elapsed Days: 7
Plan Fractions Treated to Date: 6
Plan Prescribed Dose Per Fraction: 2.5 Gy
Plan Total Fractions Prescribed: 28
Plan Total Prescribed Dose: 70 Gy
Reference Point Dosage Given to Date: 15 Gy
Reference Point Session Dosage Given: 2.5 Gy
Session Number: 6

## 2022-07-28 ENCOUNTER — Ambulatory Visit
Admission: RE | Admit: 2022-07-28 | Discharge: 2022-07-28 | Disposition: A | Discharge status: Home / Self Care | Payer: Medicare Other | Source: Ambulatory Visit | Attending: Radiation Oncology | Admitting: Radiation Oncology

## 2022-07-28 ENCOUNTER — Other Ambulatory Visit: Payer: Self-pay

## 2022-07-28 DIAGNOSIS — Z510 Encounter for antineoplastic radiation therapy: Secondary | ICD-10-CM | POA: Diagnosis not present

## 2022-07-28 DIAGNOSIS — Z191 Hormone sensitive malignancy status: Secondary | ICD-10-CM | POA: Diagnosis not present

## 2022-07-28 LAB — RAD ONC ARIA SESSION SUMMARY
Course Elapsed Days: 8
Plan Fractions Treated to Date: 7
Plan Prescribed Dose Per Fraction: 2.5 Gy
Plan Total Fractions Prescribed: 28
Plan Total Prescribed Dose: 70 Gy
Reference Point Dosage Given to Date: 17.5 Gy
Reference Point Session Dosage Given: 2.5 Gy
Session Number: 7

## 2022-07-29 ENCOUNTER — Ambulatory Visit
Admission: RE | Admit: 2022-07-29 | Discharge: 2022-07-29 | Disposition: A | Discharge status: Home / Self Care | Payer: Medicare Other | Source: Ambulatory Visit | Attending: Radiation Oncology | Admitting: Radiation Oncology

## 2022-07-29 ENCOUNTER — Other Ambulatory Visit: Payer: Self-pay

## 2022-07-29 DIAGNOSIS — Z191 Hormone sensitive malignancy status: Secondary | ICD-10-CM | POA: Diagnosis not present

## 2022-07-29 DIAGNOSIS — Z510 Encounter for antineoplastic radiation therapy: Secondary | ICD-10-CM | POA: Diagnosis not present

## 2022-07-29 LAB — RAD ONC ARIA SESSION SUMMARY
Course Elapsed Days: 9
Plan Fractions Treated to Date: 8
Plan Prescribed Dose Per Fraction: 2.5 Gy
Plan Total Fractions Prescribed: 28
Plan Total Prescribed Dose: 70 Gy
Reference Point Dosage Given to Date: 20 Gy
Reference Point Session Dosage Given: 2.5 Gy
Session Number: 8

## 2022-07-30 ENCOUNTER — Ambulatory Visit
Admission: RE | Admit: 2022-07-30 | Discharge: 2022-07-30 | Disposition: A | Discharge status: Home / Self Care | Payer: Medicare Other | Source: Ambulatory Visit | Attending: Radiation Oncology | Admitting: Radiation Oncology

## 2022-07-30 ENCOUNTER — Other Ambulatory Visit: Payer: Self-pay

## 2022-07-30 DIAGNOSIS — Z191 Hormone sensitive malignancy status: Secondary | ICD-10-CM | POA: Diagnosis not present

## 2022-07-30 DIAGNOSIS — Z510 Encounter for antineoplastic radiation therapy: Secondary | ICD-10-CM | POA: Diagnosis not present

## 2022-07-30 LAB — RAD ONC ARIA SESSION SUMMARY
Course Elapsed Days: 10
Plan Fractions Treated to Date: 9
Plan Prescribed Dose Per Fraction: 2.5 Gy
Plan Total Fractions Prescribed: 28
Plan Total Prescribed Dose: 70 Gy
Reference Point Dosage Given to Date: 22.5 Gy
Reference Point Session Dosage Given: 2.5 Gy
Session Number: 9

## 2022-07-31 ENCOUNTER — Ambulatory Visit
Admission: RE | Admit: 2022-07-31 | Discharge: 2022-07-31 | Disposition: A | Discharge status: Home / Self Care | Payer: Medicare Other | Source: Ambulatory Visit | Attending: Radiation Oncology | Admitting: Radiation Oncology

## 2022-07-31 ENCOUNTER — Other Ambulatory Visit: Payer: Self-pay

## 2022-07-31 DIAGNOSIS — Z191 Hormone sensitive malignancy status: Secondary | ICD-10-CM | POA: Diagnosis not present

## 2022-07-31 DIAGNOSIS — Z510 Encounter for antineoplastic radiation therapy: Secondary | ICD-10-CM | POA: Diagnosis not present

## 2022-07-31 LAB — RAD ONC ARIA SESSION SUMMARY
Course Elapsed Days: 11
Plan Fractions Treated to Date: 10
Plan Prescribed Dose Per Fraction: 2.5 Gy
Plan Total Fractions Prescribed: 28
Plan Total Prescribed Dose: 70 Gy
Reference Point Dosage Given to Date: 25 Gy
Reference Point Session Dosage Given: 2.5 Gy
Session Number: 10

## 2022-08-03 ENCOUNTER — Other Ambulatory Visit: Payer: Self-pay

## 2022-08-03 ENCOUNTER — Ambulatory Visit
Admission: RE | Admit: 2022-08-03 | Discharge: 2022-08-03 | Disposition: A | Discharge status: Home / Self Care | Payer: Medicare Other | Source: Ambulatory Visit | Attending: Radiation Oncology | Admitting: Radiation Oncology

## 2022-08-03 DIAGNOSIS — Z510 Encounter for antineoplastic radiation therapy: Secondary | ICD-10-CM | POA: Diagnosis not present

## 2022-08-03 DIAGNOSIS — Z191 Hormone sensitive malignancy status: Secondary | ICD-10-CM | POA: Diagnosis not present

## 2022-08-03 LAB — RAD ONC ARIA SESSION SUMMARY
Course Elapsed Days: 14
Plan Fractions Treated to Date: 11
Plan Prescribed Dose Per Fraction: 2.5 Gy
Plan Total Fractions Prescribed: 28
Plan Total Prescribed Dose: 70 Gy
Reference Point Dosage Given to Date: 27.5 Gy
Reference Point Session Dosage Given: 2.5 Gy
Session Number: 11

## 2022-08-04 ENCOUNTER — Ambulatory Visit
Admission: RE | Admit: 2022-08-04 | Discharge: 2022-08-04 | Disposition: A | Discharge status: Home / Self Care | Payer: Medicare Other | Source: Ambulatory Visit | Attending: Radiation Oncology | Admitting: Radiation Oncology

## 2022-08-04 ENCOUNTER — Other Ambulatory Visit: Payer: Self-pay

## 2022-08-04 DIAGNOSIS — Z510 Encounter for antineoplastic radiation therapy: Secondary | ICD-10-CM | POA: Diagnosis not present

## 2022-08-04 DIAGNOSIS — Z191 Hormone sensitive malignancy status: Secondary | ICD-10-CM | POA: Diagnosis not present

## 2022-08-04 LAB — RAD ONC ARIA SESSION SUMMARY
Course Elapsed Days: 15
Plan Fractions Treated to Date: 12
Plan Prescribed Dose Per Fraction: 2.5 Gy
Plan Total Fractions Prescribed: 28
Plan Total Prescribed Dose: 70 Gy
Reference Point Dosage Given to Date: 30 Gy
Reference Point Session Dosage Given: 2.5 Gy
Session Number: 12

## 2022-08-05 ENCOUNTER — Ambulatory Visit
Admission: RE | Admit: 2022-08-05 | Discharge: 2022-08-05 | Disposition: A | Discharge status: Home / Self Care | Payer: Medicare Other | Source: Ambulatory Visit | Attending: Radiation Oncology | Admitting: Radiation Oncology

## 2022-08-05 ENCOUNTER — Other Ambulatory Visit: Payer: Self-pay

## 2022-08-05 DIAGNOSIS — Z510 Encounter for antineoplastic radiation therapy: Secondary | ICD-10-CM | POA: Diagnosis not present

## 2022-08-05 DIAGNOSIS — Z191 Hormone sensitive malignancy status: Secondary | ICD-10-CM | POA: Diagnosis not present

## 2022-08-05 LAB — RAD ONC ARIA SESSION SUMMARY
Course Elapsed Days: 16
Plan Fractions Treated to Date: 13
Plan Prescribed Dose Per Fraction: 2.5 Gy
Plan Total Fractions Prescribed: 28
Plan Total Prescribed Dose: 70 Gy
Reference Point Dosage Given to Date: 32.5 Gy
Reference Point Session Dosage Given: 2.5 Gy
Session Number: 13

## 2022-08-06 ENCOUNTER — Ambulatory Visit
Admission: RE | Admit: 2022-08-06 | Discharge: 2022-08-06 | Disposition: A | Discharge status: Home / Self Care | Payer: Medicare Other | Source: Ambulatory Visit | Attending: Radiation Oncology | Admitting: Radiation Oncology

## 2022-08-06 ENCOUNTER — Ambulatory Visit: Payer: Medicare Other

## 2022-08-06 ENCOUNTER — Other Ambulatory Visit: Payer: Self-pay

## 2022-08-06 DIAGNOSIS — Z191 Hormone sensitive malignancy status: Secondary | ICD-10-CM | POA: Diagnosis not present

## 2022-08-06 DIAGNOSIS — Z510 Encounter for antineoplastic radiation therapy: Secondary | ICD-10-CM | POA: Diagnosis not present

## 2022-08-06 LAB — RAD ONC ARIA SESSION SUMMARY
Course Elapsed Days: 17
Plan Fractions Treated to Date: 14
Plan Prescribed Dose Per Fraction: 2.5 Gy
Plan Total Fractions Prescribed: 28
Plan Total Prescribed Dose: 70 Gy
Reference Point Dosage Given to Date: 35 Gy
Reference Point Session Dosage Given: 2.5 Gy
Session Number: 14

## 2022-08-07 ENCOUNTER — Other Ambulatory Visit: Payer: Self-pay

## 2022-08-07 ENCOUNTER — Ambulatory Visit: Payer: Medicare Other

## 2022-08-07 ENCOUNTER — Ambulatory Visit
Admission: RE | Admit: 2022-08-07 | Discharge: 2022-08-07 | Disposition: A | Discharge status: Home / Self Care | Payer: Medicare Other | Source: Ambulatory Visit | Attending: Radiation Oncology | Admitting: Radiation Oncology

## 2022-08-07 DIAGNOSIS — Z510 Encounter for antineoplastic radiation therapy: Secondary | ICD-10-CM | POA: Diagnosis not present

## 2022-08-07 DIAGNOSIS — Z191 Hormone sensitive malignancy status: Secondary | ICD-10-CM | POA: Diagnosis not present

## 2022-08-07 LAB — RAD ONC ARIA SESSION SUMMARY
Course Elapsed Days: 18
Plan Fractions Treated to Date: 15
Plan Prescribed Dose Per Fraction: 2.5 Gy
Plan Total Fractions Prescribed: 28
Plan Total Prescribed Dose: 70 Gy
Reference Point Dosage Given to Date: 37.5 Gy
Reference Point Session Dosage Given: 2.5 Gy
Session Number: 15

## 2022-08-10 ENCOUNTER — Other Ambulatory Visit: Payer: Self-pay

## 2022-08-10 ENCOUNTER — Ambulatory Visit
Admission: RE | Admit: 2022-08-10 | Discharge: 2022-08-10 | Disposition: A | Discharge status: Home / Self Care | Payer: Medicare Other | Source: Ambulatory Visit | Attending: Radiation Oncology | Admitting: Radiation Oncology

## 2022-08-10 DIAGNOSIS — Z510 Encounter for antineoplastic radiation therapy: Secondary | ICD-10-CM | POA: Diagnosis not present

## 2022-08-10 DIAGNOSIS — Z191 Hormone sensitive malignancy status: Secondary | ICD-10-CM | POA: Diagnosis not present

## 2022-08-10 LAB — RAD ONC ARIA SESSION SUMMARY
Course Elapsed Days: 21
Plan Fractions Treated to Date: 16
Plan Prescribed Dose Per Fraction: 2.5 Gy
Plan Total Fractions Prescribed: 28
Plan Total Prescribed Dose: 70 Gy
Reference Point Dosage Given to Date: 40 Gy
Reference Point Session Dosage Given: 2.5 Gy
Session Number: 16

## 2022-08-11 ENCOUNTER — Other Ambulatory Visit: Payer: Self-pay

## 2022-08-11 ENCOUNTER — Ambulatory Visit
Admission: RE | Admit: 2022-08-11 | Discharge: 2022-08-11 | Disposition: A | Discharge status: Home / Self Care | Payer: Medicare Other | Source: Ambulatory Visit | Attending: Radiation Oncology | Admitting: Radiation Oncology

## 2022-08-11 DIAGNOSIS — Z191 Hormone sensitive malignancy status: Secondary | ICD-10-CM | POA: Diagnosis not present

## 2022-08-11 DIAGNOSIS — Z510 Encounter for antineoplastic radiation therapy: Secondary | ICD-10-CM | POA: Diagnosis not present

## 2022-08-11 LAB — RAD ONC ARIA SESSION SUMMARY
Course Elapsed Days: 22
Plan Fractions Treated to Date: 17
Plan Prescribed Dose Per Fraction: 2.5 Gy
Plan Total Fractions Prescribed: 28
Plan Total Prescribed Dose: 70 Gy
Reference Point Dosage Given to Date: 42.5 Gy
Reference Point Session Dosage Given: 2.5 Gy
Session Number: 17

## 2022-08-12 ENCOUNTER — Ambulatory Visit
Admission: RE | Admit: 2022-08-12 | Discharge: 2022-08-12 | Disposition: A | Discharge status: Home / Self Care | Payer: Medicare Other | Source: Ambulatory Visit | Attending: Radiation Oncology | Admitting: Radiation Oncology

## 2022-08-12 ENCOUNTER — Other Ambulatory Visit: Payer: Self-pay

## 2022-08-12 ENCOUNTER — Ambulatory Visit: Payer: Medicare Other | Admitting: Family Medicine

## 2022-08-12 DIAGNOSIS — Z510 Encounter for antineoplastic radiation therapy: Secondary | ICD-10-CM | POA: Diagnosis not present

## 2022-08-12 DIAGNOSIS — Z191 Hormone sensitive malignancy status: Secondary | ICD-10-CM | POA: Diagnosis not present

## 2022-08-12 LAB — RAD ONC ARIA SESSION SUMMARY
Course Elapsed Days: 23
Plan Fractions Treated to Date: 18
Plan Prescribed Dose Per Fraction: 2.5 Gy
Plan Total Fractions Prescribed: 28
Plan Total Prescribed Dose: 70 Gy
Reference Point Dosage Given to Date: 45 Gy
Reference Point Session Dosage Given: 2.5 Gy
Session Number: 18

## 2022-08-13 ENCOUNTER — Other Ambulatory Visit: Payer: Self-pay

## 2022-08-13 ENCOUNTER — Ambulatory Visit
Admission: RE | Admit: 2022-08-13 | Discharge: 2022-08-13 | Disposition: A | Discharge status: Home / Self Care | Payer: Medicare Other | Source: Ambulatory Visit | Attending: Radiation Oncology | Admitting: Radiation Oncology

## 2022-08-13 DIAGNOSIS — Z510 Encounter for antineoplastic radiation therapy: Secondary | ICD-10-CM | POA: Diagnosis not present

## 2022-08-13 DIAGNOSIS — Z191 Hormone sensitive malignancy status: Secondary | ICD-10-CM | POA: Diagnosis not present

## 2022-08-13 LAB — RAD ONC ARIA SESSION SUMMARY
Course Elapsed Days: 24
Plan Fractions Treated to Date: 19
Plan Prescribed Dose Per Fraction: 2.5 Gy
Plan Total Fractions Prescribed: 28
Plan Total Prescribed Dose: 70 Gy
Reference Point Dosage Given to Date: 47.5 Gy
Reference Point Session Dosage Given: 2.5 Gy
Session Number: 19

## 2022-08-14 ENCOUNTER — Ambulatory Visit
Admission: RE | Admit: 2022-08-14 | Discharge: 2022-08-14 | Disposition: A | Discharge status: Home / Self Care | Payer: Medicare Other | Source: Ambulatory Visit | Attending: Radiation Oncology | Admitting: Radiation Oncology

## 2022-08-14 ENCOUNTER — Other Ambulatory Visit: Payer: Self-pay

## 2022-08-14 DIAGNOSIS — Z510 Encounter for antineoplastic radiation therapy: Secondary | ICD-10-CM | POA: Diagnosis not present

## 2022-08-14 DIAGNOSIS — Z191 Hormone sensitive malignancy status: Secondary | ICD-10-CM | POA: Diagnosis not present

## 2022-08-14 LAB — RAD ONC ARIA SESSION SUMMARY
Course Elapsed Days: 25
Plan Fractions Treated to Date: 20
Plan Prescribed Dose Per Fraction: 2.5 Gy
Plan Total Fractions Prescribed: 28
Plan Total Prescribed Dose: 70 Gy
Reference Point Dosage Given to Date: 50 Gy
Reference Point Session Dosage Given: 2.5 Gy
Session Number: 20

## 2022-08-17 ENCOUNTER — Other Ambulatory Visit: Payer: Self-pay

## 2022-08-17 ENCOUNTER — Ambulatory Visit
Admission: RE | Admit: 2022-08-17 | Discharge: 2022-08-17 | Disposition: A | Discharge status: Home / Self Care | Payer: Medicare Other | Source: Ambulatory Visit | Attending: Radiation Oncology | Admitting: Radiation Oncology

## 2022-08-17 DIAGNOSIS — Z510 Encounter for antineoplastic radiation therapy: Secondary | ICD-10-CM | POA: Diagnosis not present

## 2022-08-17 DIAGNOSIS — Z191 Hormone sensitive malignancy status: Secondary | ICD-10-CM | POA: Diagnosis not present

## 2022-08-17 LAB — RAD ONC ARIA SESSION SUMMARY
Course Elapsed Days: 28
Plan Fractions Treated to Date: 21
Plan Prescribed Dose Per Fraction: 2.5 Gy
Plan Total Fractions Prescribed: 28
Plan Total Prescribed Dose: 70 Gy
Reference Point Dosage Given to Date: 52.5 Gy
Reference Point Session Dosage Given: 2.5 Gy
Session Number: 21

## 2022-08-18 ENCOUNTER — Ambulatory Visit
Admission: RE | Admit: 2022-08-18 | Discharge: 2022-08-18 | Disposition: A | Discharge status: Home / Self Care | Payer: Medicare Other | Source: Ambulatory Visit | Attending: Radiation Oncology | Admitting: Radiation Oncology

## 2022-08-18 ENCOUNTER — Other Ambulatory Visit: Payer: Self-pay

## 2022-08-18 DIAGNOSIS — Z510 Encounter for antineoplastic radiation therapy: Secondary | ICD-10-CM | POA: Diagnosis not present

## 2022-08-18 DIAGNOSIS — Z191 Hormone sensitive malignancy status: Secondary | ICD-10-CM | POA: Diagnosis not present

## 2022-08-18 LAB — RAD ONC ARIA SESSION SUMMARY
Course Elapsed Days: 29
Plan Fractions Treated to Date: 22
Plan Prescribed Dose Per Fraction: 2.5 Gy
Plan Total Fractions Prescribed: 28
Plan Total Prescribed Dose: 70 Gy
Reference Point Dosage Given to Date: 55 Gy
Reference Point Session Dosage Given: 2.5 Gy
Session Number: 22

## 2022-08-19 ENCOUNTER — Ambulatory Visit
Admission: RE | Admit: 2022-08-19 | Discharge: 2022-08-19 | Disposition: A | Discharge status: Home / Self Care | Payer: Medicare Other | Source: Ambulatory Visit | Attending: Radiation Oncology | Admitting: Radiation Oncology

## 2022-08-19 ENCOUNTER — Other Ambulatory Visit: Payer: Self-pay

## 2022-08-19 DIAGNOSIS — Z510 Encounter for antineoplastic radiation therapy: Secondary | ICD-10-CM | POA: Diagnosis not present

## 2022-08-19 DIAGNOSIS — Z191 Hormone sensitive malignancy status: Secondary | ICD-10-CM | POA: Diagnosis not present

## 2022-08-19 LAB — RAD ONC ARIA SESSION SUMMARY
Course Elapsed Days: 30
Plan Fractions Treated to Date: 23
Plan Prescribed Dose Per Fraction: 2.5 Gy
Plan Total Fractions Prescribed: 28
Plan Total Prescribed Dose: 70 Gy
Reference Point Dosage Given to Date: 57.5 Gy
Reference Point Session Dosage Given: 2.5 Gy
Session Number: 23

## 2022-08-20 ENCOUNTER — Other Ambulatory Visit: Payer: Self-pay

## 2022-08-20 ENCOUNTER — Ambulatory Visit
Admission: RE | Admit: 2022-08-20 | Discharge: 2022-08-20 | Disposition: A | Discharge status: Home / Self Care | Payer: Medicare Other | Source: Ambulatory Visit | Attending: Radiation Oncology | Admitting: Radiation Oncology

## 2022-08-20 ENCOUNTER — Ambulatory Visit: Payer: Medicare Other

## 2022-08-20 DIAGNOSIS — Z191 Hormone sensitive malignancy status: Secondary | ICD-10-CM | POA: Diagnosis not present

## 2022-08-20 DIAGNOSIS — Z510 Encounter for antineoplastic radiation therapy: Secondary | ICD-10-CM | POA: Diagnosis not present

## 2022-08-20 LAB — RAD ONC ARIA SESSION SUMMARY
Course Elapsed Days: 31
Plan Fractions Treated to Date: 24
Plan Prescribed Dose Per Fraction: 2.5 Gy
Plan Total Fractions Prescribed: 28
Plan Total Prescribed Dose: 70 Gy
Reference Point Dosage Given to Date: 60 Gy
Reference Point Session Dosage Given: 2.5 Gy
Session Number: 24

## 2022-08-21 ENCOUNTER — Ambulatory Visit
Admission: RE | Admit: 2022-08-21 | Discharge: 2022-08-21 | Disposition: A | Discharge status: Home / Self Care | Payer: Medicare Other | Source: Ambulatory Visit | Attending: Radiation Oncology | Admitting: Radiation Oncology

## 2022-08-21 ENCOUNTER — Other Ambulatory Visit: Payer: Self-pay

## 2022-08-21 DIAGNOSIS — Z191 Hormone sensitive malignancy status: Secondary | ICD-10-CM | POA: Diagnosis not present

## 2022-08-21 DIAGNOSIS — Z510 Encounter for antineoplastic radiation therapy: Secondary | ICD-10-CM | POA: Diagnosis not present

## 2022-08-21 LAB — RAD ONC ARIA SESSION SUMMARY
Course Elapsed Days: 32
Plan Fractions Treated to Date: 25
Plan Prescribed Dose Per Fraction: 2.5 Gy
Plan Total Fractions Prescribed: 28
Plan Total Prescribed Dose: 70 Gy
Reference Point Dosage Given to Date: 62.5 Gy
Reference Point Session Dosage Given: 2.5 Gy
Session Number: 25

## 2022-08-24 ENCOUNTER — Ambulatory Visit
Admission: RE | Admit: 2022-08-24 | Discharge: 2022-08-24 | Disposition: A | Discharge status: Home / Self Care | Payer: Medicare Other | Source: Ambulatory Visit | Attending: Radiation Oncology | Admitting: Radiation Oncology

## 2022-08-24 ENCOUNTER — Other Ambulatory Visit: Payer: Self-pay

## 2022-08-24 DIAGNOSIS — Z510 Encounter for antineoplastic radiation therapy: Secondary | ICD-10-CM | POA: Diagnosis not present

## 2022-08-24 DIAGNOSIS — Z191 Hormone sensitive malignancy status: Secondary | ICD-10-CM | POA: Diagnosis not present

## 2022-08-24 LAB — RAD ONC ARIA SESSION SUMMARY
Course Elapsed Days: 35
Plan Fractions Treated to Date: 26
Plan Prescribed Dose Per Fraction: 2.5 Gy
Plan Total Fractions Prescribed: 28
Plan Total Prescribed Dose: 70 Gy
Reference Point Dosage Given to Date: 65 Gy
Reference Point Session Dosage Given: 2.5 Gy
Session Number: 26

## 2022-08-25 ENCOUNTER — Ambulatory Visit
Admission: RE | Admit: 2022-08-25 | Discharge: 2022-08-25 | Disposition: A | Discharge status: Home / Self Care | Payer: Medicare Other | Source: Ambulatory Visit | Attending: Radiation Oncology | Admitting: Radiation Oncology

## 2022-08-25 ENCOUNTER — Other Ambulatory Visit: Payer: Self-pay

## 2022-08-25 DIAGNOSIS — Z510 Encounter for antineoplastic radiation therapy: Secondary | ICD-10-CM | POA: Diagnosis not present

## 2022-08-25 DIAGNOSIS — Z191 Hormone sensitive malignancy status: Secondary | ICD-10-CM | POA: Diagnosis not present

## 2022-08-25 LAB — RAD ONC ARIA SESSION SUMMARY
Course Elapsed Days: 36
Plan Fractions Treated to Date: 27
Plan Prescribed Dose Per Fraction: 2.5 Gy
Plan Total Fractions Prescribed: 28
Plan Total Prescribed Dose: 70 Gy
Reference Point Dosage Given to Date: 67.5 Gy
Reference Point Session Dosage Given: 2.5 Gy
Session Number: 27

## 2022-08-26 ENCOUNTER — Ambulatory Visit
Admission: RE | Admit: 2022-08-26 | Discharge: 2022-08-26 | Disposition: A | Discharge status: Home / Self Care | Payer: Medicare Other | Source: Ambulatory Visit | Attending: Radiation Oncology | Admitting: Radiation Oncology

## 2022-08-26 ENCOUNTER — Other Ambulatory Visit: Payer: Self-pay

## 2022-08-26 DIAGNOSIS — Z191 Hormone sensitive malignancy status: Secondary | ICD-10-CM | POA: Diagnosis not present

## 2022-08-26 DIAGNOSIS — C61 Malignant neoplasm of prostate: Secondary | ICD-10-CM | POA: Insufficient documentation

## 2022-08-26 DIAGNOSIS — Z510 Encounter for antineoplastic radiation therapy: Secondary | ICD-10-CM | POA: Diagnosis not present

## 2022-08-26 LAB — RAD ONC ARIA SESSION SUMMARY
Course Elapsed Days: 37
Plan Fractions Treated to Date: 28
Plan Prescribed Dose Per Fraction: 2.5 Gy
Plan Total Fractions Prescribed: 28
Plan Total Prescribed Dose: 70 Gy
Reference Point Dosage Given to Date: 70 Gy
Reference Point Session Dosage Given: 2.5 Gy
Session Number: 28

## 2022-08-27 NOTE — Progress Notes (Signed)
Patient was a RadOnc Consult on 05/04/22 for his stage T1c adenocarcinoma of the prostate with Gleason score of 4+3, and PSA of 4.8. Patient proceed with treatment recommendations of 5.5 weeks of external radiation and had his final radiation treatment on 08/26/22.   Patient is scheduled for a post treatment nurse call on 10/06/22 and per urology recommendations will have PSA and MD visit early June.

## 2022-08-31 NOTE — Radiation Completion Notes (Addendum)
  Radiation Oncology         [phone] ________________________________  Name: Ronald Pitts MRN: [id number]  Date: 08/26/2022  DOB: 20-Jul-1950  End of Treatment Note  Patient Name: Ronald Pitts, Ronald Pitts MRN: [id number] Date of Birth: 05/04/1950 Referring Physician: Kasandra Knudsen, M.D. Date of Service: 2022-08-31 Radiation Oncologist: Margaretmary Bayley, M.D.  Cancer Center [facility]                             RADIATION ONCOLOGY END OF TREATMENT NOTE     Diagnosis: 72 y.o. gentleman with Stage T1c adenocarcinoma of the prostate with Gleason score of 4+3, and PSA of 4.8.  Staging on 2022-04-08: Malignant neoplasm of prostate ([facility]) T=cT1c, N=cN0, M=cM0  Intent: Curative     ==========DELIVERED PLANS==========  First Treatment Date: 2022-07-20 - Last Treatment Date: 2022-08-26   Plan Name: Prostate Site: Prostate Technique: IMRT Mode: Photon Dose Per Fraction: 2.5 Gy Prescribed Dose (Delivered / Prescribed): 70 Gy / 70 Gy Prescribed Fxs (Delivered / Prescribed): 28 / 28     ==========ON TREATMENT VISIT DATES========== 2022-07-24, 2022-07-31, 2022-08-06, 2022-08-14, 2022-08-21, 2022-08-25   See weekly On Treatment Notes is Epic for details. The patient tolerated radiation treatment relatively well with only minor urinary irritation and modest fatigue.   The patient will receive a call in about one month from the radiation oncology department. He will continue follow up with his urologist, [name] as well.   ------------------------------------------------   Margaretmary Dys, MD [facility]  Radiation Oncology Direct Dial: [phone]  Fax: [phone] [email or web address]  Skype  [email or web address]

## 2022-09-04 ENCOUNTER — Other Ambulatory Visit: Payer: Self-pay | Admitting: Family Medicine

## 2022-09-04 DIAGNOSIS — I10 Essential (primary) hypertension: Secondary | ICD-10-CM

## 2022-09-04 DIAGNOSIS — J302 Other seasonal allergic rhinitis: Secondary | ICD-10-CM

## 2022-09-15 ENCOUNTER — Other Ambulatory Visit: Payer: Self-pay | Admitting: Family Medicine

## 2022-09-15 DIAGNOSIS — I10 Essential (primary) hypertension: Secondary | ICD-10-CM

## 2022-09-17 ENCOUNTER — Other Ambulatory Visit: Payer: Self-pay | Admitting: Urology

## 2022-09-17 DIAGNOSIS — C61 Malignant neoplasm of prostate: Secondary | ICD-10-CM

## 2022-09-22 ENCOUNTER — Encounter: Payer: Self-pay | Admitting: Family Medicine

## 2022-09-22 ENCOUNTER — Ambulatory Visit (INDEPENDENT_AMBULATORY_CARE_PROVIDER_SITE_OTHER): Payer: Medicare Other | Admitting: Family Medicine

## 2022-09-22 VITALS — BP 129/79 | HR 81 | Temp 96.6°F | Ht 69.5 in | Wt 293.6 lb

## 2022-09-22 DIAGNOSIS — Z6841 Body Mass Index (BMI) 40.0 and over, adult: Secondary | ICD-10-CM

## 2022-09-22 DIAGNOSIS — I10 Essential (primary) hypertension: Secondary | ICD-10-CM | POA: Diagnosis not present

## 2022-09-22 DIAGNOSIS — C61 Malignant neoplasm of prostate: Secondary | ICD-10-CM | POA: Diagnosis not present

## 2022-09-22 NOTE — Patient Instructions (Signed)

## 2022-09-22 NOTE — Addendum Note (Signed)
Addended by: Randolf Sansoucie M on: 09/22/2022 04:19 PM   Modules accepted: Level of Service  

## 2022-09-22 NOTE — Progress Notes (Signed)
Subjective:  Patient ID: Ronald Pitts, male    DOB: Sep 17, 1950, 72 y.o.   MRN: [id number]  Patient Care Team: Sonny Masters, FNP as PCP - General (Family Medicine) Delora Fuel, OD (Optometry)   Chief Complaint:  Medical Management of Chronic Issues (6 month chronic follow up )   HPI: Ronald Pitts is a 72 y.o. male presenting on 09/22/2022 for Medical Management of Chronic Issues (6 month chronic follow up )   1. Essential (primary) hypertension Complaint with meds - Yes Current Medications - amlodipine, Microzide  Checking BP at home - no Exercising Regularly - No Watching Salt intake - Yes Pertinent ROS:  Headache - No Fatigue - Yes Visual Disturbances - No Chest pain - No Dyspnea - No Palpitations - No LE edema - No They report good compliance with medications and can restate their regimen by memory. No medication side effects.  BP Readings from Last 3 Encounters:  09/22/22 129/79  06/19/22 124/72  02/10/22 130/80     2. Morbid obesity with BMI of 40.0-44.9, adult (HCC) Does not exercise on a regular basis. Does try to watch his diet.  3. Malignant neoplasm of prostate (HCC) Has gold seed implants and underwent radiation therapy. Has follow up for repeat labs next week. Does report increased urination, no other associated symptoms.      Relevant past medical, surgical, family, and social history reviewed and updated as indicated.  Allergies and medications reviewed and updated. Data reviewed: Chart in Epic.   Past Medical History:  Diagnosis Date   History of adenomatous polyp of colon    Hypertension    Malignant neoplasm prostate [facility]) 03/2022   urologist--- dr pace/  radiation oncologsit--- dr Kathrynn Running---  dx 12/ 2023,  Gleason 4+3   OA (osteoarthritis)    Pre-diabetes    Wears partial dentures    upper and lower    Past Surgical History:  Procedure Laterality Date   COLONOSCOPY W/ POLYPECTOMY  05/08/2015   dr Leone Payor   FINGER SURGERY  Right 1970   index  ,  repair amputation injury   GOLD SEED IMPLANT N/A 06/19/2022   Procedure: GOLD SEED IMPLANT;  Surgeon: Jannifer Hick, MD;  Location: [facility];  Service: Urology;  Laterality: N/A;   SPACE OAR INSTILLATION N/A 06/19/2022   Procedure: SPACE OAR INSTILLATION;  Surgeon: Jannifer Hick, MD;  Location: [facility];  Service: Urology;  Laterality: N/A;    Social History   Socioeconomic History   Marital status: Married    Spouse name: Talbert Forest   Number of children: 2   Years of education: Not on file   Highest education level: Not on file  Occupational History   Occupation: retired  Tobacco Use   Smoking status: Former    Years: 20    Types: Cigarettes    Quit date: 1999    Years since quitting: 25.4   Smokeless tobacco: Never  Vaping Use   Vaping Use: Never used  Substance and Sexual Activity   Alcohol use: Not Currently    Comment: occasional   Drug use: Never   Sexual activity: Not on file  Other Topics Concern   Not on file  Social History Narrative   One level living with wife, Talbert Forest - son lives in [place], daughter in [place]   Social Determinants of Health   Financial Resource Strain: Low Risk  (11/10/2021)   Overall Financial Resource Strain (CARDIA)    Difficulty  of Paying Living Expenses: Not hard at all  Food Insecurity: No Food Insecurity (11/10/2021)   Hunger Vital Sign    Worried About Running Out of Food in the Last Year: Never true    Ran Out of Food in the Last Year: Never true  Transportation Needs: No Transportation Needs (11/10/2021)   PRAPARE - Administrator, Civil Service (Medical): No    Lack of Transportation (Non-Medical): No  Physical Activity: Inactive (11/10/2021)   Exercise Vital Sign    Days of Exercise per Week: 0 days    Minutes of Exercise per Session: 10 min  Stress: No Stress Concern Present (11/10/2021)   [employer] of Occupational Health - Occupational Stress  Questionnaire    Feeling of Stress : Not at all  Social Connections: Moderately Isolated (11/10/2021)   Social Connection and Isolation Panel [NHANES]    Frequency of Communication with Friends and Family: More than three times a week    Frequency of Social Gatherings with Friends and Family: More than three times a week    Attends Religious Services: Never    Database administrator or Organizations: No    Attends Banker Meetings: Never    Marital Status: Married  Catering manager Violence: Not At Risk (11/10/2021)   Humiliation, Afraid, Rape, and Kick questionnaire    Fear of Current or Ex-Partner: No    Emotionally Abused: No    Physically Abused: No    Sexually Abused: No    Outpatient Encounter Medications as of 09/22/2022  Medication Sig   amLODipine (NORVASC) 5 MG tablet TAKE 1 TABLET (5 MG TOTAL) BY MOUTH DAILY.   aspirin 81 MG tablet Take 81 mg by mouth daily.   Carboxymethylcellulose Sodium (DRY EYE RELIEF OP) Apply to eye as needed.   fluticasone (FLONASE) 50 MCG/ACT nasal spray SPRAY 2 SPRAYS INTO EACH NOSTRIL EVERY DAY   hydrochlorothiazide (MICROZIDE) 12.5 MG capsule TAKE 1 CAPSULE BY MOUTH EVERY DAY   triamcinolone cream (KENALOG) 0.1 % APPLY TO AFFECTED AREA TWICE A DAY (Patient taking differently: Apply 1 Application topically 2 (two) times daily as needed.)   Facility-Administered Encounter Medications as of 09/22/2022  Medication   sodium phosphate (FLEET) 7-19 GM/118ML enema 1 enema    No Known Allergies  Review of Systems  Constitutional:  Negative for activity change, appetite change, chills, diaphoresis, fatigue, fever and unexpected weight change.  HENT: Negative.    Eyes: Negative.  Negative for photophobia and visual disturbance.  Respiratory:  Negative for cough, chest tightness and shortness of breath.   Cardiovascular:  Negative for chest pain, palpitations and leg swelling.  Gastrointestinal:  Negative for abdominal pain, blood in stool,  constipation, diarrhea, nausea and vomiting.  Endocrine: Negative.   Genitourinary:  Positive for frequency. Negative for decreased urine volume, difficulty urinating, dysuria, enuresis, flank pain, hematuria, penile discharge, penile pain, penile swelling, scrotal swelling, testicular pain and urgency.  Musculoskeletal:  Negative for arthralgias and myalgias.  Skin: Negative.   Allergic/Immunologic: Negative.   Neurological:  Negative for dizziness, tremors, seizures, syncope, facial asymmetry, speech difficulty, weakness, light-headedness, numbness and headaches.  Hematological: Negative.   Psychiatric/Behavioral:  Negative for confusion, hallucinations, sleep disturbance and suicidal ideas.   All other systems reviewed and are negative.       Objective:  BP 129/79   Pulse 81   Temp (!) 96.6 F (35.9 C) (Temporal)   Ht 5' 9.5" (1.765 m)   Wt 293 lb 9.6 oz (133.2  kg)   SpO2 92%   BMI 42.74 kg/m    Wt Readings from Last 3 Encounters:  09/22/22 293 lb 9.6 oz (133.2 kg)  06/19/22 290 lb 6.4 oz (131.7 kg)  05/04/22 285 lb (129.3 kg)    Physical Exam Vitals and nursing note reviewed.  Constitutional:      General: He is not in acute distress.    Appearance: Normal appearance. He is well-developed and well-groomed. He is morbidly obese. He is not ill-appearing, toxic-appearing or diaphoretic.  HENT:     Head: Normocephalic and atraumatic.     Jaw: There is normal jaw occlusion.     Right Ear: Hearing normal.     Left Ear: Hearing normal.     Nose: Nose normal.     Mouth/Throat:     Lips: Pink.     Mouth: Mucous membranes are moist.     Pharynx: Oropharynx is clear. Uvula midline.  Eyes:     General: Lids are normal.     Extraocular Movements: Extraocular movements intact.     Conjunctiva/sclera: Conjunctivae normal.     Pupils: Pupils are equal, round, and reactive to light.  Neck:     Vascular: No JVD.     Trachea: Trachea and phonation normal.  Cardiovascular:      Rate and Rhythm: Normal rate and regular rhythm.     Chest Wall: PMI is not displaced.     Pulses: Normal pulses.     Heart sounds: Normal heart sounds. No murmur heard.    No friction rub. No gallop.  Pulmonary:     Effort: Pulmonary effort is normal. No respiratory distress.     Breath sounds: Normal breath sounds. No wheezing.  Abdominal:     General: Abdomen is protuberant. Bowel sounds are normal. There is no distension or abdominal bruit.     Palpations: Abdomen is soft. There is no hepatomegaly or splenomegaly.     Tenderness: There is no abdominal tenderness. There is no right CVA tenderness or left CVA tenderness.     Hernia: No hernia is present.  Musculoskeletal:        General: Normal range of motion.     Cervical back: Normal range of motion and neck supple.     Right lower leg: No edema.     Left lower leg: No edema.  Skin:    General: Skin is warm and dry.     Capillary Refill: Capillary refill takes Ronald than 2 seconds.     Coloration: Skin is not cyanotic, jaundiced or pale.     Findings: No rash.  Neurological:     General: No focal deficit present.     Mental Status: He is alert and oriented to person, place, and time.     Sensory: Sensation is intact.     Motor: Motor function is intact.     Coordination: Coordination is intact.     Gait: Gait is intact.     Deep Tendon Reflexes: Reflexes are normal and symmetric.  Psychiatric:        Attention and Perception: Attention and perception normal.        Mood and Affect: Mood and affect normal.        Speech: Speech normal.        Behavior: Behavior normal. Behavior is cooperative.        Thought Content: Thought content normal.        Cognition and Memory: Cognition and memory normal.  Judgment: Judgment normal.     Results for orders placed or performed in visit on 08/26/22  Rad Onc [facility] Session Summary  Result Value Ref Range   Course ID [email or web address]    Course Start Date 07/02/2022    Session Number  28    Course First Treatment Date 07/20/2022  2:17 PM    Course Last Treatment Date 08/26/2022 10:33 AM    Course Elapsed Days 37    Reference Point ID Prostate dp    Reference Point Dosage Given to Date 70 Gy   Reference Point Session Dosage Given 2.5 Gy   Plan ID Prostate    Plan Name Prostate    Plan Fractions Treated to Date 28    Plan Total Fractions Prescribed 28    Plan Prescribed Dose Per Fraction 2.5 Gy   Plan Total Prescribed Dose 70.000000 Gy   Plan Primary Reference Point Prostate dp        Pertinent labs & imaging results that were available during my care of the patient were reviewed by me and considered in my medical decision making.  Assessment & Plan:  Marreon was seen today for medical management of chronic issues.  Diagnoses and all orders for this visit:  Essential (primary) hypertension BP well controlled. Changes were not made in regimen today. Goal BP is 130/80. Pt aware to report any persistent high or low readings. DASH diet and exercise encouraged. Exercise at least 150 minutes per week and increase as tolerated. Goal BMI > 25. Stress management encouraged. Avoid nicotine and tobacco product use. Avoid excessive alcohol and NSAID's. Avoid more than 2000 mg of sodium daily. Medications as prescribed. Follow up as scheduled.   Morbid obesity with BMI of 40.0-44.9, adult (HCC) Diet and exercise encouraged. Has appointment for labs next week with oncology.   Malignant neoplasm of prostate (HCC) Followed by oncology on a regular basis.     Continue all other maintenance medications.  Follow up plan: Return in about 6 months (around 03/25/2023), or if symptoms worsen or fail to improve, for chronic follow up with labs.   Continue healthy lifestyle choices, including diet (rich in fruits, vegetables, and lean proteins, and low in salt and simple carbohydrates) and exercise (at least 30 minutes of moderate physical activity daily).  Educational handout given for  DASH diet, HTN  The above assessment and management plan was discussed with the patient. The patient verbalized understanding of and has agreed to the management plan. Patient is aware to call the clinic if they develop any new symptoms or if symptoms persist or worsen. Patient is aware when to return to the clinic for a follow-up visit. Patient educated on when it is appropriate to go to the emergency department.   Kari Baars, FNP-C [facility] [phone]

## 2022-10-06 ENCOUNTER — Ambulatory Visit
Admission: RE | Admit: 2022-10-06 | Discharge: 2022-10-06 | Disposition: A | Discharge status: Home / Self Care | Payer: Medicare Other | Source: Ambulatory Visit | Attending: Radiation Oncology | Admitting: Radiation Oncology

## 2022-10-06 NOTE — Progress Notes (Addendum)
  Radiation Oncology         [phone] ________________________________  Name: Ronald Pitts MRN: [id number]  Date of Service: 10/06/2022  DOB: 1950/12/20  Post Treatment Telephone Note  Diagnosis:  72 y.o. gentleman with Stage T1c adenocarcinoma of the prostate with Gleason score of 4+3, and PSA of 4.8.  Staging on 2022-04-08: Malignant neoplasm of prostate (HCC) T=cT1c, N=cN0, M=cM0(as documented in provider EOT note)   Pre Treatment IPSS Score: 2 (as documented in the provider consult note)   The patient was available for call today.   Symptoms of fatigue have improved mildly since completing therapy.  Symptoms of bladder changes have improved since completing therapy. Current symptoms include none, and medications for bladder symptoms include nocturia x3-4.  Symptoms of bowel changes have improved since completing therapy. Current symptoms include none, and medications for bowel symptoms include none.   Additional patient concern of dorsalgia x5/10, w/ an upcoming urology appt (see appt date below).  Post Treatment IPSS Score: IPSS Questionnaire (AUA-7): Over the past month.   1)  How often have you had a sensation of not emptying your bladder completely after you finish urinating?  0 - Not at all  2)  How often have you had to urinate again less than two hours after you finished urinating? 2 - Less than half the time  3)  How often have you found you stopped and started again several times when you urinated?  0 - Not at all  4) How difficult have you found it to postpone urination?  0 - Not at all  5) How often have you had a weak urinary stream?  0 - Not at all  6) How often have you had to push or strain to begin urination?  0 - Not at all  7) How many times did you most typically get up to urinate from the time you went to bed until the time you got up in the morning?  4 - 4 times  Total score:  6. Which indicates mild symptoms  0-7 mildly symptomatic   8-19 moderately  symptomatic   20-35 severely symptomatic    Patient (has a scheduled follow up visit with his urologist, [name], on 10/07/2022 for ongoing surveillance. He was counseled that PSA levels will be drawn in the urology office, and was reassured that additional time is expected to improve bowel and bladder symptoms. He was encouraged to call back with concerns or questions regarding radiation.  This concludes the interview.   Ruel Favors, LPN

## 2022-10-07 DIAGNOSIS — R350 Frequency of micturition: Secondary | ICD-10-CM | POA: Diagnosis not present

## 2022-10-21 ENCOUNTER — Inpatient Hospital Stay: Payer: Medicare Other | Attending: Adult Health | Admitting: *Deleted

## 2022-10-21 ENCOUNTER — Encounter: Payer: Self-pay | Admitting: *Deleted

## 2022-10-21 DIAGNOSIS — C61 Malignant neoplasm of prostate: Secondary | ICD-10-CM

## 2022-10-21 NOTE — Progress Notes (Signed)
SCP reviewed and completed. 

## 2022-11-13 ENCOUNTER — Ambulatory Visit (INDEPENDENT_AMBULATORY_CARE_PROVIDER_SITE_OTHER): Payer: Medicare Other

## 2022-11-13 VITALS — Ht 69.5 in | Wt 293.0 lb

## 2022-11-13 DIAGNOSIS — Z0000 Encounter for general adult medical examination without abnormal findings: Secondary | ICD-10-CM

## 2022-11-13 NOTE — Patient Instructions (Addendum)
Ronald Pitts , Thank you for taking time to come for your Medicare Wellness Visit. I appreciate your ongoing commitment to your health goals. Please review the following plan we discussed and let me know if I can assist you in the future.   These are the goals we discussed:  Goals      Patient Stated     Wants to stay healthy        This is a list of the screening recommended for you and due dates:  Health Maintenance  Topic Date Due   COVID-19 Vaccine (5 - 2023-24 season) 12/26/2021   Zoster (Shingles) Vaccine (1 of 2) 12/23/2022*   Colon Cancer Screening  02/11/2023*   Flu Shot  11/26/2022   Medicare Annual Wellness Visit  11/13/2023   DTaP/Tdap/Td vaccine (2 - Td or Tdap) 01/02/2026   Pneumonia Vaccine  Completed   Hepatitis C Screening  Completed   HPV Vaccine  Aged Out  *Topic was postponed. The date shown is not the original due date.    Advanced directives: Advance directive discussed with you today. Even though you declined this today, please call our office should you change your mind, and we can give you the proper paperwork for you to fill out. Advance care planning is a way to make decisions about medical care that fits your values in case you are ever unable to make these decisions for yourself.  Information on Advanced Care Planning can be found at [facility] [place] Advance Health Care Directives Advance Health Care Directives ([email or web address]    Conditions/risks identified:  Aim for 30 minutes of exercise or brisk walking, 6-8 glasses of water, and 5 servings of fruits and vegetables each day.    Next appointment: VIRTUAL/ TELEPHONE VISIT Follow up in one year for your annual wellness visit November 15, 2023 at 11:00 am telephone visit   Preventive Care 74 Years and Older, Male  Preventive care refers to lifestyle choices and visits with your health care provider that can promote health and wellness. What does preventive care include? A yearly  physical exam. This is also called an annual well check. Dental exams once or twice a year. Routine eye exams. Ask your health care provider how often you should have your eyes checked. Personal lifestyle choices, including: Daily care of your teeth and gums. Regular physical activity. Eating a healthy diet. Avoiding tobacco and drug use. Limiting alcohol use. Practicing safe sex. Taking low doses of aspirin every day. Taking vitamin and mineral supplements as recommended by your health care provider. What happens during an annual well check? The services and screenings done by your health care provider during your annual well check will depend on your age, overall health, lifestyle risk factors, and family history of disease. Counseling  Your health care provider may ask you questions about your: Alcohol use. Tobacco use. Drug use. Emotional well-being. Home and relationship well-being. Sexual activity. Eating habits. History of falls. Memory and ability to understand (cognition). Work and work Astronomer. Screening  You may have the following tests or measurements: Height, weight, and BMI. Blood pressure. Lipid and cholesterol levels. These may be checked every 5 years, or more frequently if you are over 28 years old. Skin check. Lung cancer screening. You may have this screening every year starting at age 55 if you have a 30-pack-year history of smoking and currently smoke or have quit within the past 15 years. Fecal occult blood test (FOBT) of the stool. You may  have this test every year starting at age 54. Flexible sigmoidoscopy or colonoscopy. You may have a sigmoidoscopy every 5 years or a colonoscopy every 10 years starting at age 74. Prostate cancer screening. Recommendations will vary depending on your family history and other risks. Hepatitis C blood test. Hepatitis B blood test. Sexually transmitted disease (STD) testing. Diabetes screening. This is done by  checking your blood sugar (glucose) after you have not eaten for a while (fasting). You may have this done every 1-3 years. Abdominal aortic aneurysm (AAA) screening. You may need this if you are a current or former smoker. Osteoporosis. You may be screened starting at age 67 if you are at high risk. Talk with your health care provider about your test results, treatment options, and if necessary, the need for more tests. Vaccines  Your health care provider may recommend certain vaccines, such as: Influenza vaccine. This is recommended every year. Tetanus, diphtheria, and acellular pertussis (Tdap, Td) vaccine. You may need a Td booster every 10 years. Zoster vaccine. You may need this after age 57. Pneumococcal 13-valent conjugate (PCV13) vaccine. One dose is recommended after age 58. Pneumococcal polysaccharide (PPSV23) vaccine. One dose is recommended after age 35. Talk to your health care provider about which screenings and vaccines you need and how often you need them. This information is not intended to replace advice given to you by your health care provider. Make sure you discuss any questions you have with your health care provider. Document Released: 05/10/2015 Document Revised: 01/01/2016 Document Reviewed: 02/12/2015 Elsevier Interactive Patient Education  2017 [employer].  Fall Prevention in the Home Falls can cause injuries. They can happen to people of all ages. There are many things you can do to make your home safe and to help prevent falls. What can I do on the outside of my home? Regularly fix the edges of walkways and driveways and fix any cracks. Remove anything that might make you trip as you walk through a door, such as a raised step or threshold. Trim any bushes or trees on the path to your home. Use bright outdoor lighting. Clear any walking paths of anything that might make someone trip, such as rocks or tools. Regularly check to see if handrails are loose or  broken. Make sure that both sides of any steps have handrails. Any raised decks and porches should have guardrails on the edges. Have any leaves, snow, or ice cleared regularly. Use sand or salt on walking paths during winter. Clean up any spills in your garage right away. This includes oil or grease spills. What can I do in the bathroom? Use night lights. Install grab bars by the toilet and in the tub and shower. Do not use towel bars as grab bars. Use non-skid mats or decals in the tub or shower. If you need to sit down in the shower, use a plastic, non-slip stool. Keep the floor dry. Clean up any water that spills on the floor as soon as it happens. Remove soap buildup in the tub or shower regularly. Attach bath mats securely with double-sided non-slip rug tape. Do not have throw rugs and other things on the floor that can make you trip. What can I do in the bedroom? Use night lights. Make sure that you have a light by your bed that is easy to reach. Do not use any sheets or blankets that are too big for your bed. They should not hang down onto the floor. Have a firm  chair that has side arms. You can use this for support while you get dressed. Do not have throw rugs and other things on the floor that can make you trip. What can I do in the kitchen? Clean up any spills right away. Avoid walking on wet floors. Keep items that you use a lot in easy-to-reach places. If you need to reach something above you, use a strong step stool that has a grab bar. Keep electrical cords out of the way. Do not use floor polish or wax that makes floors slippery. If you must use wax, use non-skid floor wax. Do not have throw rugs and other things on the floor that can make you trip. What can I do with my stairs? Do not leave any items on the stairs. Make sure that there are handrails on both sides of the stairs and use them. Fix handrails that are broken or loose. Make sure that handrails are as long as  the stairways. Check any carpeting to make sure that it is firmly attached to the stairs. Fix any carpet that is loose or worn. Avoid having throw rugs at the top or bottom of the stairs. If you do have throw rugs, attach them to the floor with carpet tape. Make sure that you have a light switch at the top of the stairs and the bottom of the stairs. If you do not have them, ask someone to add them for you. What else can I do to help prevent falls? Wear shoes that: Do not have high heels. Have rubber bottoms. Are comfortable and fit you well. Are closed at the toe. Do not wear sandals. If you use a stepladder: Make sure that it is fully opened. Do not climb a closed stepladder. Make sure that both sides of the stepladder are locked into place. Ask someone to hold it for you, if possible. Clearly mark and make sure that you can see: Any grab bars or handrails. First and last steps. Where the edge of each step is. Use tools that help you move around (mobility aids) if they are needed. These include: Canes. Walkers. Scooters. Crutches. Turn on the lights when you go into a dark area. Replace any light bulbs as soon as they burn out. Set up your furniture so you have a clear path. Avoid moving your furniture around. If any of your floors are uneven, fix them. If there are any pets around you, be aware of where they are. Review your medicines with your doctor. Some medicines can make you feel dizzy. This can increase your chance of falling. Ask your doctor what other things that you can do to help prevent falls. This information is not intended to replace advice given to you by your health care provider. Make sure you discuss any questions you have with your health care provider. Document Released: 02/07/2009 Document Revised: 09/19/2015 Document Reviewed: 05/18/2014 Elsevier Interactive Patient Education  2017 [employer].

## 2022-11-13 NOTE — Progress Notes (Signed)
 Because this visit was a virtual/telehealth visit,  certain criteria was not obtained, such a blood pressure, CBG if patient is a diabetic, and timed up and go. Any medications not marked as "taking" was not mentioned during the medication reconciliation part of the visit. Any vitals not documented were not able to be obtained due to this being a telehealth visit. Vitals documented are verbally provided by the patient.   Subjective:   Ronald Pitts is a 72 y.o. male who presents for Medicare Annual/Subsequent preventive examination.  Visit Complete: Virtual  I connected with  Ronald Pitts on 11/13/22 by a audio enabled telemedicine application and verified that I am speaking with the correct person using two identifiers.  Patient Location: Home  Provider Location: Home Office  I discussed the limitations of evaluation and management by telemedicine. The patient expressed understanding and agreed to proceed.  Patient Medicare AWV questionnaire was completed by the patient on n/a; I have confirmed that all information answered by patient is correct and no changes since this date.  Review of Systems     Cardiac Risk Factors include: advanced age (>27men, >8 women);hypertension;male gender;obesity (BMI >30kg/m2);sedentary lifestyle     Objective:    Today's Vitals   11/13/22 1357  Weight: 293 lb (132.9 kg)  Height: 5' 9.5" (1.765 m)   Body mass index is 42.65 kg/m.     11/13/2022    2:00 PM 06/19/2022    6:46 AM 05/04/2022    8:33 AM 11/10/2021   10:36 AM 11/07/2020   10:57 AM 01/02/2016    5:22 PM 11/02/2015    8:43 AM  Advanced Directives  Does Patient Have a Medical Advance Directive? No Yes Yes Yes Yes Yes Yes  Type of Furniture conservator/restorer;Living will Living will Healthcare Power of [place];Living will Healthcare Power of [place];Living will Healthcare Power of [place];Living will Living will;Healthcare Power of Attorney  Does patient want to make  changes to medical advance directive?  No - Patient declined       Copy of Healthcare Power of Attorney in Chart?  No - copy requested  No - copy requested No - copy requested    Would patient like information on creating a medical advance directive? No - Patient declined          Current Medications (verified) Outpatient Encounter Medications as of 11/13/2022  Medication Sig   amLODipine (NORVASC) 5 MG tablet TAKE 1 TABLET (5 MG TOTAL) BY MOUTH DAILY.   aspirin 81 MG tablet Take 81 mg by mouth daily.   fluticasone (FLONASE) 50 MCG/ACT nasal spray SPRAY 2 SPRAYS INTO EACH NOSTRIL EVERY DAY   hydrochlorothiazide (MICROZIDE) 12.5 MG capsule TAKE 1 CAPSULE BY MOUTH EVERY DAY   Carboxymethylcellulose Sodium (DRY EYE RELIEF OP) Apply to eye as needed. (Patient not taking: Reported on 10/21/2022)   triamcinolone cream (KENALOG) 0.1 % APPLY TO AFFECTED AREA TWICE A DAY (Patient not taking: Reported on 10/21/2022)   Facility-Administered Encounter Medications as of 11/13/2022  Medication   sodium phosphate (FLEET) 7-19 GM/118ML enema 1 enema    Allergies (verified) Patient has no known allergies.   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   urologist--- dr pace/  radiation oncologsit--- dr Kathrynn Running---  dx 12/ 2023,  Gleason 4+3   OA (osteoarthritis)    Pre-diabetes    Wears partial dentures  upper and lower   Past Surgical History:  Procedure Laterality Date   COLONOSCOPY W/ POLYPECTOMY  05/08/2015   dr Leone Payor   FINGER SURGERY Right 1970   index  ,  repair amputation injury   GOLD SEED IMPLANT N/A 06/19/2022   Procedure: GOLD SEED IMPLANT;  Surgeon: Jannifer Hick, MD;  Location: [facility];  Service: Urology;  Laterality: N/A;   SPACE OAR INSTILLATION N/A 06/19/2022   Procedure: SPACE OAR INSTILLATION;  Surgeon: Jannifer Hick, MD;  Location: [facility];  Service: Urology;  Laterality: N/A;   Family History  Problem Relation Age of Onset   Colon cancer Neg Hx    Social History   Socioeconomic History   Marital status: Married    Spouse name: Talbert Forest   Number of children: 2   Years of education: Not on file   Highest education level: Not on file  Occupational History   Occupation: retired  Tobacco Use   Smoking status: Former    Current packs/day: 0.00    Types: Cigarettes    Start date: 1979    Quit date: 1999    Years since quitting: 25.5   Smokeless tobacco: Never  Vaping Use   Vaping status: Never Used  Substance and Sexual Activity   Alcohol use: Not Currently    Comment: occasional   Drug use: Never   Sexual activity: Not on file  Other Topics Concern   Not on file  Social History Narrative   One level living with wife, Talbert Forest - son lives in [place], daughter in [place]   Social Determinants of Health   Financial Resource Strain: Low Risk  (11/13/2022)   Overall Financial Resource Strain (CARDIA)    Difficulty of Paying Living Expenses: Not hard at all  Food Insecurity: No Food Insecurity (11/13/2022)   Hunger Vital Sign    Worried About Running Out of Food in the Last Year: Never true    Ran Out of Food in the Last Year: Never true  Transportation Needs: No Transportation Needs (11/13/2022)   PRAPARE - Administrator, Civil Service (Medical): No    Lack of Transportation (Non-Medical): No  Physical Activity: Inactive (11/13/2022)   Exercise Vital Sign    Days of Exercise per Week: 0 days    Minutes of Exercise per Session: 0 min  Stress: No Stress Concern Present (11/13/2022)   [employer] of Occupational Health - Occupational Stress Questionnaire    Feeling of Stress : Not at all  Social Connections: Moderately Isolated (11/13/2022)   Social Connection and Isolation Panel [NHANES]    Frequency of Communication with Friends and Family: More than three times a week    Frequency of  Social Gatherings with Friends and Family: More than three times a week    Attends Religious Services: Never    Database administrator or Organizations: No    Attends Engineer, structural: Never    Marital Status: Married    Tobacco Counseling Counseling given: Yes   Clinical Intake:  Pre-visit preparation completed: Yes  Pain : No/denies pain     BMI - recorded: 42.65 Nutritional Status: BMI > 30  Obese Nutritional Risks: None Diabetes: No  How often do you need to have someone help you when you read instructions, pamphlets, or other written materials from your doctor or pharmacy?: 1 - Never  Interpreter Needed?: No  Information entered by ::  Yukie Bergeron, CMA   Activities  of Daily Living    11/13/2022    1:59 PM 06/19/2022    6:50 AM  In your present state of health, do you have any difficulty performing the following activities:  Hearing? 0 0  Vision? 0 0  Difficulty concentrating or making decisions? 0 0  Walking or climbing stairs? 0 0  Dressing or bathing? 0 0  Doing errands, shopping? 0   Preparing Food and eating ? N   Using the Toilet? N   In the past six months, have you accidently leaked urine? N   Do you have problems with loss of bowel control? N   Managing your Medications? N   Managing your Finances? N   Housekeeping or managing your Housekeeping? N     Patient Care Team: Sonny Masters, FNP as PCP - General (Family Medicine) Delora Fuel, [place] (Optometry) Jannifer Hick, MD as Consulting Physician (Urology) Margaretmary Dys, MD as Consulting Physician (Radiation Oncology)  Indicate any recent Medical Services you may have received from other than [facility] providers in the past year (date may be approximate).     Assessment:   This is a routine wellness examination for [employer].  Hearing/Vision screen Hearing Screening - Comments:: Patient denies any hearing difficulties.    Dietary issues and exercise activities discussed:      Goals Addressed             This Visit's Progress    Patient Stated   On track    Wants to stay healthy       Depression Screen    11/13/2022    2:01 PM 09/22/2022    8:55 AM 02/10/2022    8:56 AM 11/10/2021   10:35 AM 03/25/2021    8:31 AM 02/07/2021    1:09 PM 11/07/2020   10:40 AM  PHQ 2/9 Scores  PHQ - 2 Score 0 0 0 0 0 0 0  PHQ- 9 Score 0 3 0  0 0     Fall Risk    11/13/2022    2:00 PM 09/22/2022    8:55 AM 02/10/2022    8:56 AM 11/10/2021   10:33 AM 03/25/2021    8:31 AM  Fall Risk   Falls in the past year? 0 0 0 0 0  Number falls in past yr: 0   0   Injury with Fall? 0   0   Risk for fall due to : No Fall Risks   Orthopedic patient   Follow up Falls prevention discussed   Falls prevention discussed     MEDICARE RISK AT HOME:  Medicare Risk at Home - 11/13/22 1400     Any stairs in or around the home? No    If so, are there any without handrails? No    Home free of loose throw rugs in walkways, pet beds, electrical cords, etc? Yes    Adequate lighting in your home to reduce risk of falls? Yes    Life alert? No    Use of a cane, walker or w/c? No    Grab bars in the bathroom? Yes    Shower chair or bench in shower? Yes    Elevated toilet seat or a handicapped toilet? Yes             TIMED UP AND GO:  Was the test performed?  No    Cognitive Function:        11/13/2022    2:00 PM 11/10/2021   10:37 AM  11/07/2020   10:57 AM  6CIT Screen  What Year? 0 points 0 points 0 points  What month? 0 points 0 points 0 points  What time? 0 points 0 points 0 points  Count back from 20 0 points 0 points 0 points  Months in reverse 0 points 0 points 0 points  Repeat phrase 0 points 0 points 0 points  Total Score 0 points 0 points 0 points    Immunizations Immunization History  Administered Date(s) Administered   Fluad Quad(high Dose 65+) 01/19/2019, 01/23/2020, 02/07/2021, 02/10/2022   Influenza Split 04/27/2012, 01/30/2020   Influenza, High Dose  Seasonal PF 03/08/2015, 02/19/2016, 01/27/2017, 03/07/2018   Influenza,trivalent, recombinat, inj, PF 03/06/2014   Influenza-Unspecified 03/06/2014   PFIZER(Purple Top)SARS-COV-2 Vaccination 05/17/2019, 06/07/2019, 03/06/2020, 05/12/2020   Pneumococcal Conjugate-13 03/08/2015   Pneumococcal Polysaccharide-23 04/27/2009, 02/19/2016   Tdap 01/03/2016    TDAP status: Up to date  Flu Vaccine status: Up to date  Pneumococcal vaccine status: Up to date  Covid-19 vaccine status: Information provided on how to obtain vaccines.   Qualifies for Shingles Vaccine? Yes   Zostavax completed No   Shingrix Completed?: No.    Education has been provided regarding the importance of this vaccine. Patient has been advised to call insurance company to determine out of pocket expense if they have not yet received this vaccine. Advised may also receive vaccine at local pharmacy or Health Dept. Verbalized acceptance and understanding.  Screening Tests Health Maintenance  Topic Date Due   COVID-19 Vaccine (5 - 2023-24 season) 12/26/2021   Medicare Annual Wellness (AWV)  11/11/2022   Zoster Vaccines- Shingrix (1 of 2) 12/23/2022 (Originally 08/29/1969)   Colonoscopy  02/11/2023 (Originally 05/07/2020)   INFLUENZA VACCINE  11/26/2022   DTaP/Tdap/Td (2 - Td or Tdap) 01/02/2026   Pneumonia Vaccine 59+ Years old  Completed   Hepatitis C Screening  Completed   HPV VACCINES  Aged Out    Health Maintenance  Health Maintenance Due  Topic Date Due   COVID-19 Vaccine (5 - 2023-24 season) 12/26/2021   Medicare Annual Wellness (AWV)  11/11/2022    Colorectal Cancer Screening: Patient declined  Lung Cancer Screening: (Low Dose CT Chest recommended if Age 22-80 years, 20 pack-year currently smoking OR have quit w/in 15years.) does not qualify.   Lung Cancer Screening Referral: na  Additional Screening:  Hepatitis C Screening: does not qualify; Completed 01/19/2019  Vision Screening: Recommended annual  ophthalmology exams for early detection of glaucoma and other disorders of the eye. Is the patient up to date with their annual eye exam?  Yes  Who is the provider or what is the name of the office in which the patient attends annual eye exams? Delora Fuel If pt is not established with a provider, would they like to be referred to a provider to establish care? No .   Dental Screening: Recommended annual dental exams for proper oral hygiene  Diabetic Foot Exam: n/a  Community Resource Referral / Chronic Care Management: CRR required this visit?  No   CCM required this visit?  No     Plan:     I have personally reviewed and noted the following in the patient's chart:   Medical and social history Use of alcohol, tobacco or illicit drugs  Current medications and supplements including opioid prescriptions. Patient is not currently taking opioid prescriptions. Functional ability and status Nutritional status Physical activity Advanced directives List of other physicians Hospitalizations, surgeries, and ER visits in previous 12 months Vitals  Screenings to include cognitive, depression, and falls Referrals and appointments  In addition, I have reviewed and discussed with patient certain preventive protocols, quality metrics, and best practice recommendations. A written personalized care plan for preventive services as well as general preventive health recommendations were provided to patient.     Jordan Hawks Julieta Rogalski, CMA   11/13/2022   After Visit Summary: (MyChart) Due to this being a telephonic visit, the after visit summary with patients personalized plan was offered to patient via MyChart   Nurse Notes:

## 2022-11-17 NOTE — Addendum Note (Signed)
Addended by: Recardo Evangelist A on: 11/17/2022 08:37 PM   Modules accepted: Level of Service

## 2022-12-10 ENCOUNTER — Other Ambulatory Visit: Payer: Self-pay | Admitting: Family Medicine

## 2022-12-10 DIAGNOSIS — I10 Essential (primary) hypertension: Secondary | ICD-10-CM

## 2023-01-07 ENCOUNTER — Other Ambulatory Visit: Payer: Self-pay | Admitting: Family Medicine

## 2023-01-07 DIAGNOSIS — I10 Essential (primary) hypertension: Secondary | ICD-10-CM

## 2023-01-08 DIAGNOSIS — R350 Frequency of micturition: Secondary | ICD-10-CM | POA: Diagnosis not present

## 2023-02-08 DIAGNOSIS — C44219 Basal cell carcinoma of skin of left ear and external auricular canal: Secondary | ICD-10-CM | POA: Diagnosis not present

## 2023-03-22 DIAGNOSIS — Z85828 Personal history of other malignant neoplasm of skin: Secondary | ICD-10-CM | POA: Diagnosis not present

## 2023-03-22 DIAGNOSIS — Z08 Encounter for follow-up examination after completed treatment for malignant neoplasm: Secondary | ICD-10-CM | POA: Diagnosis not present

## 2023-03-30 ENCOUNTER — Encounter: Payer: Self-pay | Admitting: Family Medicine

## 2023-03-30 ENCOUNTER — Ambulatory Visit (INDEPENDENT_AMBULATORY_CARE_PROVIDER_SITE_OTHER): Payer: Medicare Other | Admitting: Family Medicine

## 2023-03-30 VITALS — BP 122/72 | HR 76 | Temp 97.5°F | Ht 69.5 in | Wt 295.4 lb

## 2023-03-30 DIAGNOSIS — R7303 Prediabetes: Secondary | ICD-10-CM

## 2023-03-30 DIAGNOSIS — I10 Essential (primary) hypertension: Secondary | ICD-10-CM

## 2023-03-30 DIAGNOSIS — G8929 Other chronic pain: Secondary | ICD-10-CM | POA: Diagnosis not present

## 2023-03-30 DIAGNOSIS — E782 Mixed hyperlipidemia: Secondary | ICD-10-CM | POA: Diagnosis not present

## 2023-03-30 DIAGNOSIS — C61 Malignant neoplasm of prostate: Secondary | ICD-10-CM

## 2023-03-30 DIAGNOSIS — Z6841 Body Mass Index (BMI) 40.0 and over, adult: Secondary | ICD-10-CM | POA: Diagnosis not present

## 2023-03-30 DIAGNOSIS — M25562 Pain in left knee: Secondary | ICD-10-CM

## 2023-03-30 DIAGNOSIS — M25561 Pain in right knee: Secondary | ICD-10-CM

## 2023-03-30 LAB — BAYER DCA HB A1C WAIVED: HB A1C (BAYER DCA - WAIVED): 6.4 % — ABNORMAL HIGH (ref 4.8–5.6)

## 2023-03-30 MED ORDER — HYDROCHLOROTHIAZIDE 12.5 MG PO CAPS
25.0000 mg | ORAL_CAPSULE | Freq: Every day | ORAL | 1 refills | Status: DC
Start: 2023-03-30 — End: 2023-09-28

## 2023-03-30 MED ORDER — HYDROCHLOROTHIAZIDE 12.5 MG PO CAPS
12.5000 mg | ORAL_CAPSULE | Freq: Every day | ORAL | 1 refills | Status: DC
Start: 2023-03-30 — End: 2023-03-30

## 2023-03-30 NOTE — Progress Notes (Signed)
Subjective:  Patient ID: Ronald Pitts, male    DOB: 19-Nov-1950, 72 y.o.   MRN: [id number]  Patient Care Team: Sonny Masters, FNP as PCP - General (Family Medicine) Delora Fuel, [place] (Optometry) Jannifer Hick, MD as Consulting Physician (Urology) Margaretmary Dys, MD as Consulting Physician (Radiation Oncology)   Chief Complaint:  Medical Management of Chronic Issues (6 month follow up )   HPI: Ronald Pitts is a 72 y.o. male presenting on 03/30/2023 for Medical Management of Chronic Issues (6 month follow up )   Discussed the use of AI scribe software for clinical note transcription with the patient, who gave verbal consent to proceed.  History of Present Illness   The patient, with a history of prediabetes, hypertension, arthritis, and prostate cancer, reports no significant changes in his health status. He denies any new symptoms such as headaches, chest pain, leg swelling, or shortness of breath, but does report occasional headaches.  The patient's arthritis pain is a daily occurrence, affecting multiple areas including the hips, knees, and back. He manages this pain with Tylenol, which provides some relief.  He is currently on amlodipine and hydrochlorothiazide for hypertension, with no reported side effects. He denies any symptoms of prediabetes such as increased thirst, urination, or hunger.  Regarding his prostate cancer, he is under regular monitoring with PSA checks. He denies any changes in urinary habits or presence of blood in urine. He reports a lack of energy since undergoing radiation treatment, which he attributes to the treatment's side effects.  The patient also reports nocturia, waking up approximately twice a night to urinate. He has noticed swelling in his feet and ankles. Despite this, he is generally active, engaging in activities such as cutting grass.  He has been proactive in his health maintenance, with recent visits to the eye doctor and an upcoming  dental appointment. He has received his flu shot for the year and has completed his pneumonia vaccines. However, he has declined the shingles vaccines.  The patient's cholesterol levels have historically been low, and he is not currently on any cholesterol medications. He has also declined further colonoscopies.          Relevant past medical, surgical, family, and social history reviewed and updated as indicated.  Allergies and medications reviewed and updated. Data reviewed: Chart in Epic.   Past Medical History:  Diagnosis Date   History of adenomatous polyp of colon    Hypertension    Malignant neoplasm prostate [facility]) 03/2022   urologist--- dr pace/  radiation oncologsit--- dr Kathrynn Running---  dx 12/ 2023,  Gleason 4+3   OA (osteoarthritis)    Pre-diabetes    Wears partial dentures    upper and lower    Past Surgical History:  Procedure Laterality Date   COLONOSCOPY W/ POLYPECTOMY  05/08/2015   dr Leone Payor   FINGER SURGERY Right 1970   index  ,  repair amputation injury   GOLD SEED IMPLANT N/A 06/19/2022   Procedure: GOLD SEED IMPLANT;  Surgeon: Jannifer Hick, MD;  Location: [facility];  Service: Urology;  Laterality: N/A;   SPACE OAR INSTILLATION N/A 06/19/2022   Procedure: SPACE OAR INSTILLATION;  Surgeon: Jannifer Hick, MD;  Location: [facility];  Service: Urology;  Laterality: N/A;    Social History   Socioeconomic History   Marital status: Married    Spouse name: Ronald Pitts   Number of children: 2   Years of education: Not on file  Highest education level: Not on file  Occupational History   Occupation: retired  Tobacco Use   Smoking status: Former    Current packs/day: 0.00    Types: Cigarettes    Start date: 1979    Quit date: 1999    Years since quitting: 25.9   Smokeless tobacco: Never  Vaping Use   Vaping status: Never Used  Substance and Sexual Activity   Alcohol use: Not Currently    Comment: occasional   Drug use:  Never   Sexual activity: Not on file  Other Topics Concern   Not on file  Social History Narrative   One level living with wife, Ronald Pitts - son lives in [place], daughter in [place]   Social Determinants of Health   Financial Resource Strain: Low Risk  (11/13/2022)   Overall Financial Resource Strain (CARDIA)    Difficulty of Paying Living Expenses: Not hard at all  Food Insecurity: No Food Insecurity (11/13/2022)   Hunger Vital Sign    Worried About Running Out of Food in the Last Year: Never true    Ran Out of Food in the Last Year: Never true  Transportation Needs: No Transportation Needs (11/13/2022)   PRAPARE - Administrator, Civil Service (Medical): No    Lack of Transportation (Non-Medical): No  Physical Activity: Inactive (11/13/2022)   Exercise Vital Sign    Days of Exercise per Week: 0 days    Minutes of Exercise per Session: 0 min  Stress: No Stress Concern Present (11/13/2022)   [employer] of Occupational Health - Occupational Stress Questionnaire    Feeling of Stress : Not at all  Social Connections: Moderately Isolated (11/13/2022)   Social Connection and Isolation Panel [NHANES]    Frequency of Communication with Friends and Family: More than three times a week    Frequency of Social Gatherings with Friends and Family: More than three times a week    Attends Religious Services: Never    Database administrator or Organizations: No    Attends Banker Meetings: Never    Marital Status: Married  Catering manager Violence: Not At Risk (11/13/2022)   Humiliation, Afraid, Rape, and Kick questionnaire    Fear of Current or Ex-Partner: No    Emotionally Abused: No    Physically Abused: No    Sexually Abused: No    Outpatient Encounter Medications as of 03/30/2023  Medication Sig   amLODipine (NORVASC) 5 MG tablet TAKE 1 TABLET (5 MG TOTAL) BY MOUTH DAILY.   aspirin 81 MG tablet Take 81 mg by mouth daily.   Carboxymethylcellulose Sodium (DRY  EYE RELIEF OP) Apply to eye as needed.   fluticasone (FLONASE) 50 MCG/ACT nasal spray SPRAY 2 SPRAYS INTO EACH NOSTRIL EVERY DAY   triamcinolone cream (KENALOG) 0.1 % APPLY TO AFFECTED AREA TWICE A DAY   [DISCONTINUED] hydrochlorothiazide (MICROZIDE) 12.5 MG capsule TAKE 1 CAPSULE BY MOUTH EVERY DAY   hydrochlorothiazide (MICROZIDE) 12.5 MG capsule Take 2 capsules (25 mg total) by mouth daily.   [DISCONTINUED] hydrochlorothiazide (MICROZIDE) 12.5 MG capsule Take 1 capsule (12.5 mg total) by mouth daily.   Facility-Administered Encounter Medications as of 03/30/2023  Medication   sodium phosphate (FLEET) 7-19 GM/118ML enema 1 enema    No Known Allergies  Pertinent ROS per HPI, otherwise unremarkable      Objective:  BP 122/72   Pulse 76   Temp (!) 97.5 F (36.4 C) (Temporal)   Ht 5' 9.5" (1.765 m)  Wt 295 lb 6.4 oz (134 kg)   SpO2 90%   BMI 43.00 kg/m    Wt Readings from Last 3 Encounters:  03/30/23 295 lb 6.4 oz (134 kg)  11/13/22 293 lb (132.9 kg)  09/22/22 293 lb 9.6 oz (133.2 kg)    Physical Exam Vitals and nursing note reviewed.  Constitutional:      General: He is not in acute distress.    Appearance: Normal appearance. He is well-developed and well-groomed. He is morbidly obese. He is not ill-appearing, toxic-appearing or diaphoretic.  HENT:     Head: Normocephalic and atraumatic.     Jaw: There is normal jaw occlusion.     Right Ear: Hearing normal.     Left Ear: Hearing normal.     Nose: Nose normal.     Mouth/Throat:     Lips: Pink.     Mouth: Mucous membranes are moist.     Pharynx: Oropharynx is clear. Uvula midline.  Eyes:     General: Lids are normal.     Extraocular Movements: Extraocular movements intact.     Conjunctiva/sclera: Conjunctivae normal.     Pupils: Pupils are equal, round, and reactive to light.  Neck:     Thyroid: No thyroid mass, thyromegaly or thyroid tenderness.     Vascular: No carotid bruit or JVD.     Trachea: Trachea and  phonation normal.  Cardiovascular:     Rate and Rhythm: Normal rate and regular rhythm.     Chest Wall: PMI is not displaced.     Pulses: Normal pulses.     Heart sounds: Normal heart sounds. No murmur heard.    No friction rub. No gallop.  Pulmonary:     Effort: Pulmonary effort is normal. No respiratory distress.     Breath sounds: Normal breath sounds. No wheezing.  Abdominal:     General: Abdomen is protuberant. Bowel sounds are normal. There is no distension or abdominal bruit.     Palpations: Abdomen is soft. There is no hepatomegaly or splenomegaly.     Tenderness: There is no abdominal tenderness. There is no right CVA tenderness or left CVA tenderness.     Hernia: No hernia is present.  Musculoskeletal:        General: Normal range of motion.     Cervical back: Normal range of motion and neck supple.     Right lower leg: No edema.     Left lower leg: No edema.  Lymphadenopathy:     Cervical: No cervical adenopathy.  Skin:    General: Skin is warm and dry.     Capillary Refill: Capillary refill takes less than 2 seconds.     Coloration: Skin is not cyanotic, jaundiced or pale.     Findings: No rash.  Neurological:     General: No focal deficit present.     Mental Status: He is alert and oriented to person, place, and time.     Sensory: Sensation is intact.     Motor: Motor function is intact.     Coordination: Coordination is intact.     Gait: Gait is intact.     Deep Tendon Reflexes: Reflexes are normal and symmetric.  Psychiatric:        Attention and Perception: Attention and perception normal.        Mood and Affect: Mood and affect normal.        Speech: Speech normal.        Behavior: Behavior normal. Behavior  is cooperative.        Thought Content: Thought content normal.        Cognition and Memory: Cognition and memory normal.        Judgment: Judgment normal.     Results for orders placed or performed in visit on 08/26/22  Rad Onc [facility] Session Summary   Result Value Ref Range   Course ID [id number]    Course Start Date 07/02/2022    Session Number 28    Course First Treatment Date 07/20/2022  2:17 PM    Course Last Treatment Date 08/26/2022 10:33 AM    Course Elapsed Days 37    Reference Point ID Prostate dp    Reference Point Dosage Given to Date 70 Gy   Reference Point Session Dosage Given 2.5 Gy   Plan ID Prostate    Plan Name Prostate    Plan Fractions Treated to Date 28    Plan Total Fractions Prescribed 28    Plan Prescribed Dose Per Fraction 2.5 Gy   Plan Total Prescribed Dose 70.000000 Gy   Plan Primary Reference Point Prostate dp        Pertinent labs & imaging results that were available during my care of the patient were reviewed by me and considered in my medical decision making.  Assessment & Plan:  Caius was seen today for medical management of chronic issues.  Diagnoses and all orders for this visit:  Morbid obesity with BMI of 40.0-44.9, adult (HCC) -     CMP14+EGFR -     CBC with Differential/Platelet -     Lipid panel -     Thyroid Panel With TSH -     Bayer DCA Hb A1c Waived  Essential (primary) hypertension -     Discontinue: hydrochlorothiazide (MICROZIDE) 12.5 MG capsule; Take 1 capsule (12.5 mg total) by mouth daily. -     CMP14+EGFR -     CBC with Differential/Platelet -     Thyroid Panel With TSH -     hydrochlorothiazide (MICROZIDE) 12.5 MG capsule; Take 2 capsules (25 mg total) by mouth daily.  Mixed hyperlipidemia -     CMP14+EGFR -     Lipid panel  Chronic pain of both knees No new or worsening symptoms.   Prediabetes -     CMP14+EGFR -     Bayer DCA Hb A1c Waived  Malignant neoplasm of prostate (HCC) -     CBC with Differential/Platelet     Assessment and Plan    Hypertension Blood pressure is well-controlled at 122/72 mmHg. No side effects from amlodipine and hydrochlorothiazide. Mild peripheral edema noted. Discussed increasing hydrochlorothiazide dose to manage edema. -  Increase hydrochlorothiazide to 25 mg, two pills daily in the morning - Advise to decrease salt intake - Monitor for effectiveness and report if swelling persists  Osteoarthritis Chronic pain in hips, knees, and back. Managed with Tylenol, providing partial relief. Discussed alternative pain management strategies if pain worsens. - Continue Tylenol for pain management - Consider alternative pain management strategies if pain worsens  Prostate Cancer Under surveillance with PSA monitoring. No significant changes in symptoms. Last PSA was 1.074. Discussed good prognosis with regular monitoring. - Continue regular PSA monitoring with oncologist - Follow up with oncologist every six months  Prediabetes No current symptoms. Discussed importance of periodic blood glucose monitoring to prevent progression to diabetes. - Monitor blood glucose levels periodically  General Health Maintenance Received flu and pneumonia vaccines. Declined shingles vaccine. Bowel habits normal.  Regular dental and eye check-ups maintained. - No further action for flu and pneumonia vaccines - Respect decision to decline shingles vaccine - Continue regular dental and eye check-ups  Follow-up - Order lab work including thyroid function and blood counts - Schedule follow-up appointment in six months.       Continue all other maintenance medications.  Follow up plan: Return in about 6 months (around 09/28/2023), or if symptoms worsen or fail to improve, for CPE.   Continue healthy lifestyle choices, including diet (rich in fruits, vegetables, and lean proteins, and low in salt and simple carbohydrates) and exercise (at least 30 minutes of moderate physical activity daily).  Educational handout given for health maintenance, DASH diet  The above assessment and management plan was discussed with the patient. The patient verbalized understanding of and has agreed to the management plan. Patient is aware to call the  clinic if they develop any new symptoms or if symptoms persist or worsen. Patient is aware when to return to the clinic for a follow-up visit. Patient educated on when it is appropriate to go to the emergency department.   Kari Baars, FNP-C [facility] [phone]

## 2023-03-30 NOTE — Patient Instructions (Signed)

## 2023-03-31 ENCOUNTER — Other Ambulatory Visit: Payer: Self-pay | Admitting: *Deleted

## 2023-03-31 DIAGNOSIS — E875 Hyperkalemia: Secondary | ICD-10-CM

## 2023-03-31 LAB — CBC WITH DIFFERENTIAL/PLATELET
Basophils Absolute: 0 10*3/uL (ref 0.0–0.2)
Basos: 0 %
EOS (ABSOLUTE): 0.1 10*3/uL (ref 0.0–0.4)
Eos: 2 %
Hematocrit: 47.5 % (ref 37.5–51.0)
Hemoglobin: 15.6 g/dL (ref 13.0–17.7)
Immature Grans (Abs): 0.1 10*3/uL (ref 0.0–0.1)
Immature Granulocytes: 1 %
Lymphocytes Absolute: 1.3 10*3/uL (ref 0.7–3.1)
Lymphs: 17 %
MCH: 29.8 pg (ref 26.6–33.0)
MCHC: 32.8 g/dL (ref 31.5–35.7)
MCV: 91 fL (ref 79–97)
Monocytes Absolute: 0.6 10*3/uL (ref 0.1–0.9)
Monocytes: 9 %
Neutrophils Absolute: 5.2 10*3/uL (ref 1.4–7.0)
Neutrophils: 71 %
Platelets: 254 10*3/uL (ref 150–450)
RBC: 5.23 x10E6/uL (ref 4.14–5.80)
RDW: 11.8 % (ref 11.6–15.4)
WBC: 7.4 10*3/uL (ref 3.4–10.8)

## 2023-03-31 LAB — THYROID PANEL WITH TSH
Free Thyroxine Index: 2.7 (ref 1.2–4.9)
T3 Uptake Ratio: 27 % (ref 24–39)
T4, Total: 10 ug/dL (ref 4.5–12.0)
TSH: 2.1 u[IU]/mL (ref 0.450–4.500)

## 2023-03-31 LAB — CMP14+EGFR
ALT: 10 [IU]/L (ref 0–44)
AST: 17 [IU]/L (ref 0–40)
Albumin: 4.1 g/dL (ref 3.8–4.8)
Alkaline Phosphatase: 89 [IU]/L (ref 44–121)
BUN/Creatinine Ratio: 22 (ref 10–24)
BUN: 22 mg/dL (ref 8–27)
Bilirubin Total: 0.4 mg/dL (ref 0.0–1.2)
CO2: 30 mmol/L — ABNORMAL HIGH (ref 20–29)
Calcium: 9.7 mg/dL (ref 8.6–10.2)
Chloride: 97 mmol/L (ref 96–106)
Creatinine, Ser: 0.98 mg/dL (ref 0.76–1.27)
Globulin, Total: 2 g/dL (ref 1.5–4.5)
Glucose: 146 mg/dL — ABNORMAL HIGH (ref 70–99)
Potassium: 5.3 mmol/L — ABNORMAL HIGH (ref 3.5–5.2)
Sodium: 139 mmol/L (ref 134–144)
Total Protein: 6.1 g/dL (ref 6.0–8.5)
eGFR: 81 mL/min/{1.73_m2} (ref >59)

## 2023-03-31 LAB — LIPID PANEL
Chol/HDL Ratio: 2.9 {ratio} (ref 0.0–5.0)
Cholesterol, Total: 108 mg/dL (ref 100–199)
HDL: 37 mg/dL — ABNORMAL LOW (ref >39)
LDL Chol Calc (NIH): 54 mg/dL (ref 0–99)
Triglycerides: 88 mg/dL (ref 0–149)
VLDL Cholesterol Cal: 17 mg/dL (ref 5–40)

## 2023-04-02 ENCOUNTER — Other Ambulatory Visit: Payer: Medicare Other

## 2023-04-02 DIAGNOSIS — E875 Hyperkalemia: Secondary | ICD-10-CM | POA: Diagnosis not present

## 2023-04-02 LAB — POTASSIUM: Potassium: 4.6 mmol/L (ref 3.5–5.2)

## 2023-05-21 ENCOUNTER — Other Ambulatory Visit: Payer: Self-pay | Admitting: Family Medicine

## 2023-05-21 DIAGNOSIS — J302 Other seasonal allergic rhinitis: Secondary | ICD-10-CM

## 2023-06-01 ENCOUNTER — Other Ambulatory Visit: Payer: Self-pay | Admitting: Family Medicine

## 2023-06-01 DIAGNOSIS — I10 Essential (primary) hypertension: Secondary | ICD-10-CM

## 2023-06-10 ENCOUNTER — Telehealth: Payer: Self-pay

## 2023-06-10 DIAGNOSIS — H2513 Age-related nuclear cataract, bilateral: Secondary | ICD-10-CM | POA: Diagnosis not present

## 2023-06-10 DIAGNOSIS — D3132 Benign neoplasm of left choroid: Secondary | ICD-10-CM | POA: Diagnosis not present

## 2023-06-10 DIAGNOSIS — H532 Diplopia: Secondary | ICD-10-CM | POA: Diagnosis not present

## 2023-06-10 DIAGNOSIS — H353131 Nonexudative age-related macular degeneration, bilateral, early dry stage: Secondary | ICD-10-CM | POA: Diagnosis not present

## 2023-06-10 DIAGNOSIS — H40023 Open angle with borderline findings, high risk, bilateral: Secondary | ICD-10-CM | POA: Diagnosis not present

## 2023-06-10 NOTE — Telephone Encounter (Signed)
Copied from CRM [id number]. Topic: Clinical - Medical Advice >> Jun 10, 2023  1:04 PM Carlatta H wrote: Reason for CRM: Patients eyedr called and stated that they wanted to schedule a Carotid Eye Study for the patient//Eye doctor is concerned about both eyes//Exam was faxed at 1pm//Please reach to the patient

## 2023-06-11 ENCOUNTER — Ambulatory Visit: Payer: Medicare Other | Admitting: Family Medicine

## 2023-06-11 ENCOUNTER — Encounter: Payer: Self-pay | Admitting: Family Medicine

## 2023-06-11 VITALS — BP 135/82 | HR 86 | Temp 97.0°F | Ht 69.5 in | Wt 290.0 lb

## 2023-06-11 DIAGNOSIS — H532 Diplopia: Secondary | ICD-10-CM

## 2023-06-11 DIAGNOSIS — E782 Mixed hyperlipidemia: Secondary | ICD-10-CM | POA: Diagnosis not present

## 2023-06-11 NOTE — Progress Notes (Signed)
Subjective:  Patient ID: Ronald Pitts, male    DOB: 1950-12-16, 73 y.o.   MRN: [id number]  Patient Care Team: Sonny Masters, FNP as PCP - General (Family Medicine) Delora Fuel, [place] (Optometry) Jannifer Hick, MD as Consulting Physician (Urology) Margaretmary Dys, MD as Consulting Physician (Radiation Oncology)   Chief Complaint:  Diplopia (X 1 week on and off.  Patient went to the eye doctor yesterday and states they said he was fine and told him to come get checked here. )   HPI: Ronald Pitts is a 73 y.o. male presenting on 06/11/2023 for Diplopia (X 1 week on and off.  Patient went to the eye doctor yesterday and states they said he was fine and told him to come get checked here. )   Discussed the use of AI scribe software for clinical note transcription with the patient, who gave verbal consent to proceed.  History of Present Illness   Ronald Pitts is a 73 year old male who presents with intermittent double vision. He was referred by Delora Fuel for evaluation of possible carotid artery blockage or infection.  He experiences intermittent double vision that resolves upon closing one eye, regardless of which eye is closed. The double vision is not affected by wearing glasses and is particularly noticeable when driving, as the white line on the road appears to move. He had a recent eye examination where photos of his eyes were taken, and the back of his eyes was checked. The eye doctor suggested the possibility of a carotid artery blockage or an infection and recommended further evaluation.  No significant symptoms such as headaches, dizziness, fatigue, weakness, or muscle aches. He occasionally experiences a headache but describes it as insignificant. He also reports seeing floaters occasionally.          Relevant past medical, surgical, family, and social history reviewed and updated as indicated.  Allergies and medications reviewed and updated. Data reviewed: Chart in  Epic.   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   urologist--- dr pace/  radiation oncologsit--- dr Kathrynn Running---  dx 12/ 2023,  Gleason 4+3   OA (osteoarthritis)    Pre-diabetes    Wears partial dentures    upper and lower    Past Surgical History:  Procedure Laterality Date   COLONOSCOPY W/ POLYPECTOMY  05/08/2015   dr Leone Payor   FINGER SURGERY Right 1970   index  ,  repair amputation injury   GOLD SEED IMPLANT N/A 06/19/2022   Procedure: GOLD SEED IMPLANT;  Surgeon: Jannifer Hick, MD;  Location: [facility];  Service: Urology;  Laterality: N/A;   SPACE OAR INSTILLATION N/A 06/19/2022   Procedure: SPACE OAR INSTILLATION;  Surgeon: Jannifer Hick, MD;  Location: [facility];  Service: Urology;  Laterality: N/A;    Social History   Socioeconomic History   Marital status: Married    Spouse name: Talbert Forest   Number of children: 2   Years of education: Not on file   Highest education level: Not on file  Occupational History   Occupation: retired  Tobacco Use   Smoking status: Former    Current packs/day: 0.00    Types: Cigarettes    Start date: 1979    Quit date: 1999    Years since quitting: 26.1   Smokeless tobacco: Never  Vaping Use  Vaping status: Never Used  Substance and Sexual Activity   Alcohol use: Not Currently    Comment: occasional   Drug use: Never   Sexual activity: Not on file  Other Topics Concern   Not on file  Social History Narrative   One level living with wife, Talbert Forest - son lives in [place], daughter in [place]   Social Drivers of Health   Financial Resource Strain: Low Risk  (11/13/2022)   Overall Financial Resource Strain (CARDIA)    Difficulty of Paying Living Expenses: Not hard at all  Food Insecurity: No Food Insecurity (11/13/2022)   Hunger Vital Sign    Worried About Running Out of Food in the Last Year:  Never true    Ran Out of Food in the Last Year: Never true  Transportation Needs: No Transportation Needs (11/13/2022)   PRAPARE - Administrator, Civil Service (Medical): No    Lack of Transportation (Non-Medical): No  Physical Activity: Inactive (11/13/2022)   Exercise Vital Sign    Days of Exercise per Week: 0 days    Minutes of Exercise per Session: 0 min  Stress: No Stress Concern Present (11/13/2022)   [employer] of Occupational Health - Occupational Stress Questionnaire    Feeling of Stress : Not at all  Social Connections: Moderately Isolated (11/13/2022)   Social Connection and Isolation Panel [NHANES]    Frequency of Communication with Friends and Family: More than three times a week    Frequency of Social Gatherings with Friends and Family: More than three times a week    Attends Religious Services: Never    Database administrator or Organizations: No    Attends Banker Meetings: Never    Marital Status: Married  Catering manager Violence: Not At Risk (11/13/2022)   Humiliation, Afraid, Rape, and Kick questionnaire    Fear of Current or Ex-Partner: No    Emotionally Abused: No    Physically Abused: No    Sexually Abused: No    Outpatient Encounter Medications as of 06/11/2023  Medication Sig   amLODipine (NORVASC) 5 MG tablet TAKE 1 TABLET (5 MG TOTAL) BY MOUTH DAILY.   aspirin 81 MG tablet Take 81 mg by mouth daily.   Carboxymethylcellulose Sodium (DRY EYE RELIEF OP) Apply to eye as needed.   fluticasone (FLONASE) 50 MCG/ACT nasal spray SPRAY 2 SPRAYS INTO EACH NOSTRIL EVERY DAY   hydrochlorothiazide (MICROZIDE) 12.5 MG capsule Take 2 capsules (25 mg total) by mouth daily.   triamcinolone cream (KENALOG) 0.1 % APPLY TO AFFECTED AREA TWICE A DAY   Facility-Administered Encounter Medications as of 06/11/2023  Medication   sodium phosphate (FLEET) 7-19 GM/118ML enema 1 enema    No Known Allergies  Pertinent ROS per HPI, otherwise  unremarkable      Objective:  BP 135/82   Pulse 86   Temp (!) 97 F (36.1 C)   Ht 5' 9.5" (1.765 m)   Wt 290 lb (131.5 kg)   SpO2 94%   BMI 42.21 kg/m    Wt Readings from Last 3 Encounters:  06/11/23 290 lb (131.5 kg)  03/30/23 295 lb 6.4 oz (134 kg)  11/13/22 293 lb (132.9 kg)    Physical Exam Vitals and nursing note reviewed.  Constitutional:      General: He is not in acute distress.    Appearance: Normal appearance. He is morbidly obese. He is not ill-appearing, toxic-appearing or diaphoretic.  HENT:     Head: Normocephalic and  atraumatic.     Nose: Nose normal.     Mouth/Throat:     Mouth: Mucous membranes are moist.     Pharynx: Oropharynx is clear.  Eyes:     General: Lids are normal. Vision grossly intact. Gaze aligned appropriately. No visual field deficit.    Extraocular Movements: Extraocular movements intact.     Conjunctiva/sclera: Conjunctivae normal.     Pupils: Pupils are equal, round, and reactive to light.  Neck:     Vascular: No carotid bruit.  Cardiovascular:     Rate and Rhythm: Normal rate and regular rhythm.     Heart sounds: Normal heart sounds.  Pulmonary:     Effort: Pulmonary effort is normal.     Breath sounds: Normal breath sounds.  Musculoskeletal:     Cervical back: Neck supple.  Skin:    General: Skin is warm and dry.     Capillary Refill: Capillary refill takes less than 2 seconds.  Neurological:     General: No focal deficit present.     Mental Status: He is alert and oriented to person, place, and time.  Psychiatric:        Mood and Affect: Mood normal.        Behavior: Behavior normal.        Thought Content: Thought content normal.        Judgment: Judgment normal.     Results for orders placed or performed in visit on 04/02/23  Potassium   Collection Time: 04/02/23  9:18 AM  Result Value Ref Range   Potassium 4.6 3.5 - 5.2 mmol/L       Pertinent labs & imaging results that were available during my care of the  patient were reviewed by me and considered in my medical decision making.  Assessment & Plan:  Aadon was seen today for diplopia.  Diagnoses and all orders for this visit:  Diplopia -     CBC with Differential/Platelet -     CMP14+EGFR -     Lipid panel -     Thyroid Panel With TSH -     ANA Comprehensive Panel -     US Carotid Bilateral; Future     Assessment and Plan    Intermittent Diplopia Intermittent double vision, unaffected by closing either eye or wearing glasses. No associated symptoms such as significant headaches, dizziness, or other neurological deficits. Differential diagnosis includes carotid artery blockage, infection, or autoimmune disorders. Discussed potential need for imaging studies such as MRI of head and neck, retinal imaging, or carotid ultrasound. Explained that blood work will include ANA to evaluate for autoimmune disorders such as lupus or MS, and routine labs to check blood counts, thyroid function, cholesterol, and electrolytes. Results will guide further management. - Order ANA to evaluate for autoimmune disorders such as lupus or MS - Order routine blood work including blood counts, thyroid function, cholesterol, and electrolytes - Contact Delora Fuel to discuss specific imaging recommendations (MRI of head and neck, retinal imaging, or carotid ultrasound) - Order appropriate imaging based on discussion with Delora Fuel  General Health Maintenance Routine health maintenance discussed. Previous blood work in December showed slightly elevated blood sugar but normal cholesterol levels. - Perform routine blood work including blood sugar and cholesterol levels  Follow-up - Reach out to [employer] today to discuss imaging recommendations - Inform patient of any abnormal results from ANA or routine blood work - Schedule follow-up appointment based on diagnostic findings.  Continue all other maintenance medications.  Follow up  plan: Return if symptoms worsen or fail to improve.   Continue healthy lifestyle choices, including diet (rich in fruits, vegetables, and lean proteins, and low in salt and simple carbohydrates) and exercise (at least 30 minutes of moderate physical activity daily).   The above assessment and management plan was discussed with the patient. The patient verbalized understanding of and has agreed to the management plan. Patient is aware to call the clinic if they develop any new symptoms or if symptoms persist or worsen. Patient is aware when to return to the clinic for a follow-up visit. Patient educated on when it is appropriate to go to the emergency department.   Kari Baars, FNP-C [facility] [phone]

## 2023-06-14 ENCOUNTER — Encounter: Payer: Self-pay | Admitting: Family Medicine

## 2023-06-15 LAB — CMP14+EGFR
ALT: 8 [IU]/L (ref 0–44)
AST: 15 [IU]/L (ref 0–40)
Albumin: 4.5 g/dL (ref 3.8–4.8)
Alkaline Phosphatase: 93 [IU]/L (ref 44–121)
BUN/Creatinine Ratio: 22 (ref 10–24)
BUN: 20 mg/dL (ref 8–27)
Bilirubin Total: 0.5 mg/dL (ref 0.0–1.2)
CO2: 28 mmol/L (ref 20–29)
Calcium: 9.1 mg/dL (ref 8.6–10.2)
Chloride: 97 mmol/L (ref 96–106)
Creatinine, Ser: 0.92 mg/dL (ref 0.76–1.27)
Globulin, Total: 2.3 g/dL (ref 1.5–4.5)
Glucose: 162 mg/dL — ABNORMAL HIGH (ref 70–99)
Potassium: 4 mmol/L (ref 3.5–5.2)
Sodium: 140 mmol/L (ref 134–144)
Total Protein: 6.8 g/dL (ref 6.0–8.5)
eGFR: 87 mL/min/{1.73_m2} (ref >59)

## 2023-06-15 LAB — CBC WITH DIFFERENTIAL/PLATELET
Basophils Absolute: 0 10*3/uL (ref 0.0–0.2)
Basos: 1 %
EOS (ABSOLUTE): 0.1 10*3/uL (ref 0.0–0.4)
Eos: 1 %
Hematocrit: 48.9 % (ref 37.5–51.0)
Hemoglobin: 16.3 g/dL (ref 13.0–17.7)
Immature Grans (Abs): 0.1 10*3/uL (ref 0.0–0.1)
Immature Granulocytes: 1 %
Lymphocytes Absolute: 1.5 10*3/uL (ref 0.7–3.1)
Lymphs: 20 %
MCH: 29.5 pg (ref 26.6–33.0)
MCHC: 33.3 g/dL (ref 31.5–35.7)
MCV: 89 fL (ref 79–97)
Monocytes Absolute: 0.7 10*3/uL (ref 0.1–0.9)
Monocytes: 9 %
Neutrophils Absolute: 5.2 10*3/uL (ref 1.4–7.0)
Neutrophils: 68 %
Platelets: 266 10*3/uL (ref 150–450)
RBC: 5.52 x10E6/uL (ref 4.14–5.80)
RDW: 12 % (ref 11.6–15.4)
WBC: 7.5 10*3/uL (ref 3.4–10.8)

## 2023-06-15 LAB — THYROID PANEL WITH TSH
Free Thyroxine Index: 2.4 (ref 1.2–4.9)
T3 Uptake Ratio: 25 % (ref 24–39)
T4, Total: 9.7 ug/dL (ref 4.5–12.0)
TSH: 1.5 u[IU]/mL (ref 0.450–4.500)

## 2023-06-15 LAB — ANA COMPREHENSIVE PANEL
Anti JO-1: <0.2 AI (ref 0.0–0.9)
Centromere Ab Screen: <0.2 AI (ref 0.0–0.9)
Chromatin Ab SerPl-aCnc: <0.2 AI (ref 0.0–0.9)
ENA RNP Ab: 0.6 AI (ref 0.0–0.9)
ENA SM Ab Ser-aCnc: <0.2 AI (ref 0.0–0.9)
ENA SSA (RO) Ab: <0.2 AI (ref 0.0–0.9)
ENA SSB (LA) Ab: <0.2 AI (ref 0.0–0.9)
Scleroderma (Scl-70) (ENA) Antibody, IgG: <0.2 AI (ref 0.0–0.9)
dsDNA Ab: <1 [IU]/mL (ref 0–9)

## 2023-06-15 LAB — LIPID PANEL
Chol/HDL Ratio: 3.4 {ratio} (ref 0.0–5.0)
Cholesterol, Total: 115 mg/dL (ref 100–199)
HDL: 34 mg/dL — ABNORMAL LOW (ref >39)
LDL Chol Calc (NIH): 60 mg/dL (ref 0–99)
Triglycerides: 114 mg/dL (ref 0–149)
VLDL Cholesterol Cal: 21 mg/dL (ref 5–40)

## 2023-08-16 ENCOUNTER — Ambulatory Visit: Payer: Self-pay | Admitting: Family Medicine

## 2023-08-16 NOTE — Telephone Encounter (Signed)
 Chief Complaint: Sore throat x2 days  Symptoms: Left side is worse, 5-6/10 pain  Pertinent Negatives: Patient denies fever, cough, difficulty breathing  Disposition:  [x] Appointment(virtual)  Additional Notes: Pt scheduled for virtual appointment tomorrow morning with PCP. This RN educated pt on when to call back if symptoms worsen. Pt verbalized understanding and agrees to plan.    Copied from CRM [id number]. Topic: Clinical - Medical Advice >> Aug 16, 2023  4:31 PM Ethelle Herb L wrote: Reason for CRM: Patient requesting something be called in for his sore throat. Pt has had it for a couple of days due to a cold.   Best call back number: [phone] Reason for Disposition  [1] Sore throat is the only symptom AND [2] present > 48 hours  Answer Assessment - Initial Assessment Questions ONSET: "When did the throat start hurting?" (Hours or days ago)      Two days SEVERITY: "How bad is the sore throat?" (Scale 1-10; mild, moderate or severe)   - MILD (1-3):  Doesn't interfere with eating or normal activities.   - MODERATE (4-7): Interferes with eating some solids and normal activities.   - SEVERE (8-10):  Excruciating pain, interferes with most normal activities.   - SEVERE WITH DYSPHAGIA (10): Can't swallow liquids, drooling.     5-6/10 on left side STREP EXPOSURE: "Has there been any exposure to strep within the past week?" If Yes, ask: "What type of contact occurred?"      Denies, pt was around grand kids who had a sinus infection OTHER SYMPTOMS: "Do you have any other symptoms?" (e.g., difficulty breathing, headache, rash)     Denies  Protocols used: Sore Throat-A-AH

## 2023-08-17 ENCOUNTER — Ambulatory Visit: Admitting: Family Medicine

## 2023-09-23 DIAGNOSIS — Z85828 Personal history of other malignant neoplasm of skin: Secondary | ICD-10-CM | POA: Diagnosis not present

## 2023-09-23 DIAGNOSIS — Z08 Encounter for follow-up examination after completed treatment for malignant neoplasm: Secondary | ICD-10-CM | POA: Diagnosis not present

## 2023-09-28 ENCOUNTER — Encounter: Payer: Self-pay | Admitting: Family Medicine

## 2023-09-28 ENCOUNTER — Ambulatory Visit: Payer: Medicare Other | Admitting: Family Medicine

## 2023-09-28 VITALS — BP 121/67 | HR 78 | Temp 97.3°F | Ht 69.5 in | Wt 295.6 lb

## 2023-09-28 DIAGNOSIS — Z6841 Body Mass Index (BMI) 40.0 and over, adult: Secondary | ICD-10-CM

## 2023-09-28 DIAGNOSIS — Z0000 Encounter for general adult medical examination without abnormal findings: Secondary | ICD-10-CM

## 2023-09-28 DIAGNOSIS — C61 Malignant neoplasm of prostate: Secondary | ICD-10-CM

## 2023-09-28 DIAGNOSIS — E782 Mixed hyperlipidemia: Secondary | ICD-10-CM | POA: Diagnosis not present

## 2023-09-28 DIAGNOSIS — I10 Essential (primary) hypertension: Secondary | ICD-10-CM | POA: Diagnosis not present

## 2023-09-28 DIAGNOSIS — Z0001 Encounter for general adult medical examination with abnormal findings: Secondary | ICD-10-CM | POA: Diagnosis not present

## 2023-09-28 DIAGNOSIS — R7303 Prediabetes: Secondary | ICD-10-CM

## 2023-09-28 DIAGNOSIS — Z23 Encounter for immunization: Secondary | ICD-10-CM

## 2023-09-28 LAB — BAYER DCA HB A1C WAIVED: HB A1C (BAYER DCA - WAIVED): 7.3 % — ABNORMAL HIGH (ref 4.8–5.6)

## 2023-09-28 LAB — LIPID PANEL

## 2023-09-28 MED ORDER — HYDROCHLOROTHIAZIDE 50 MG PO TABS: 50.0000 mg | ORAL_TABLET | Freq: Every day | ORAL | 1 refills | Status: DC

## 2023-09-28 NOTE — Progress Notes (Signed)
 Complete physical exam  Patient: Ronald Pitts   DOB: 05-Mar-1951   73 y.o. Male  MRN: [id number]  Subjective:     Chief Complaint  Patient presents with   Annual Exam    Ronald Pitts is a 73 y.o. male who presents today for a complete physical exam. He reports consuming a general diet. The patient does not participate in regular exercise at present. He generally feels well. He reports sleeping well. He does have additional problems to discuss today.   Ronald Pitts is a 73 year old male who presents for an annual physical exam.  He experiences a sensation of warmth in his legs, particularly noticeable at night while in bed. This sensation is described as feeling like a suntan and is not influenced by activity or position. There is no associated discoloration, numbness, tingling, or pain, and no identifiable factors exacerbate or alleviate the sensation.  He notes regular ankle swelling, especially upon getting up and moving around. He has not tried compression socks for this issue. His current medications include Norvasc  (amlodipine ) 5 mg for blood pressure and hydrochlorothiazide  12.5 mg as a diuretic. No dizziness, headaches, or other side effects from these medications are reported.  He has a history of prostate cancer and is under regular surveillance with oncology visits every six months. His last PSA test was three months ago, with a result of 0.01. No urinary symptoms such as increased frequency at night, weight changes, or hematuria.  He mentions a past issue with double vision, attributed to dry eyes, and uses eye drops as needed, which have been helpful. No recent changes in vision or hearing are noted, although he humorously mentions his wife's comments on his hearing.  His bowel habits are normal, and he has no other significant complaints. He is due for a colonoscopy at the end of the year.      Most recent fall risk assessment:    09/28/2023    8:34 AM  Fall Risk   Falls  in the past year? 0  Number falls in past yr: 0  Injury with Fall? 0  Risk for fall due to : No Fall Risks  Follow up Falls evaluation completed     Most recent depression screenings:    09/28/2023    8:34 AM 06/11/2023   10:45 AM  PHQ 2/9 Scores  PHQ - 2 Score 0 0  PHQ- 9 Score 0 0    Vision:Within last year, Dental: No current dental problems, and PSA: Agrees to PSA testing  Patient Active Problem List   Diagnosis Date Noted   Malignant neoplasm of prostate (HCC) 05/04/2022   Osteoarthritis of left hip 01/04/2022   Chronic pain of both knees 02/07/2021   Prediabetes    Morbid obesity with BMI of 40.0-44.9, adult (HCC) 01/07/2016   Essential (primary) hypertension 11/29/2013   Mixed hyperlipidemia 11/29/2013   History of colonic polyps 04/03/2010   Past Medical History:  Diagnosis Date   History of adenomatous polyp of colon    Hypertension    Malignant neoplasm prostate (HCC) 03/2022   urologist--- dr pace/  radiation oncologsit--- dr Lorri Rota---  dx 12/ 2023,  Gleason 4+3   OA (osteoarthritis)    Pre-diabetes    Wears partial dentures    upper and lower   Past Surgical History:  Procedure Laterality Date   COLONOSCOPY W/ POLYPECTOMY  05/08/2015   dr Willy Harvest   FINGER SURGERY Right 1970   index  ,  repair amputation injury  GOLD SEED IMPLANT N/A 06/19/2022   Procedure: GOLD SEED IMPLANT;  Surgeon: Lahoma Pigg, MD;  Location: [facility];  Service: Urology;  Laterality: N/A;   SPACE OAR INSTILLATION N/A 06/19/2022   Procedure: SPACE OAR INSTILLATION;  Surgeon: Lahoma Pigg, MD;  Location: [facility];  Service: Urology;  Laterality: N/A;   Social History   Tobacco Use   Smoking status: Former    Current packs/day: 0.00    Types: Cigarettes    Start date: 1979    Quit date: 1999    Years since quitting: 26.4   Smokeless tobacco: Never  Vaping Use   Vaping status: Never Used  Substance Use Topics   Alcohol use: Not Currently     Comment: occasional   Drug use: Never   Social History   Socioeconomic History   Marital status: Married    Spouse name: Monroe Antigua   Number of children: 2   Years of education: Not on file   Highest education level: 12th grade  Occupational History   Occupation: retired  Tobacco Use   Smoking status: Former    Current packs/day: 0.00    Types: Cigarettes    Start date: 1979    Quit date: 1999    Years since quitting: 26.4   Smokeless tobacco: Never  Vaping Use   Vaping status: Never Used  Substance and Sexual Activity   Alcohol use: Not Currently    Comment: occasional   Drug use: Never   Sexual activity: Not on file  Other Topics Concern   Not on file  Social History Narrative   One level living with wife, Monroe Antigua - son lives in [place], daughter in [place]   Social Drivers of Health   Financial Resource Strain: Low Risk  (08/17/2023)   Overall Financial Resource Strain (CARDIA)    Difficulty of Paying Living Expenses: Not hard at all  Food Insecurity: No Food Insecurity (08/17/2023)   Hunger Vital Sign    Worried About Running Out of Food in the Last Year: Never true    Ran Out of Food in the Last Year: Never true  Transportation Needs: No Transportation Needs (08/17/2023)   PRAPARE - Administrator, Civil Service (Medical): No    Lack of Transportation (Non-Medical): No  Physical Activity: Insufficiently Active (08/17/2023)   Exercise Vital Sign    Days of Exercise per Week: 1 day    Minutes of Exercise per Session: 10 min  Stress: No Stress Concern Present (08/17/2023)   [employer] of Occupational Health - Occupational Stress Questionnaire    Feeling of Stress : Not at all  Social Connections: Moderately Integrated (08/17/2023)   Social Connection and Isolation Panel [NHANES]    Frequency of Communication with Friends and Family: More than three times a week    Frequency of Social Gatherings with Friends and Family: Twice a week    Attends  Religious Services: 1 to 4 times per year    Active Member of [employer] or Organizations: No    Attends Banker Meetings: Never    Marital Status: Married  Catering manager Violence: Not At Risk (11/13/2022)   Humiliation, Afraid, Rape, and Kick questionnaire    Fear of Current or Ex-Partner: No    Emotionally Abused: No    Physically Abused: No    Sexually Abused: No   Family Status  Relation Name Status   Mother  Deceased   Father  Deceased  Sister  Nature conservation officer   Daughter  Alive   Son  Alive   Sister  Alive   Sister  Alive   Neg Hx  (Not Specified)  No partnership data on file   Family History  Problem Relation Age of Onset   Colon cancer Neg Hx    No Known Allergies    Patient Care Team: Randalyn Ahmed, Georgeann Kindred, FNP as PCP - General (Family Medicine) Alexia Idler, OD (Optometry) Lahoma Pigg, MD as Consulting Physician (Urology) Kenith Payer, MD as Consulting Physician (Radiation Oncology)   Outpatient Medications Prior to Visit  Medication Sig   aspirin 81 MG tablet Take 81 mg by mouth daily.   Carboxymethylcellulose Sodium (DRY EYE RELIEF OP) Apply to eye as needed.   fluticasone  (FLONASE ) 50 MCG/ACT nasal spray SPRAY 2 SPRAYS INTO EACH NOSTRIL EVERY DAY   triamcinolone  cream (KENALOG ) 0.1 % APPLY TO AFFECTED AREA TWICE A DAY   [DISCONTINUED] amLODipine  (NORVASC ) 5 MG tablet TAKE 1 TABLET (5 MG TOTAL) BY MOUTH DAILY.   [DISCONTINUED] hydrochlorothiazide  (MICROZIDE ) 12.5 MG capsule Take 2 capsules (25 mg total) by mouth daily.   Facility-Administered Medications Prior to Visit  Medication Dose Route Frequency Provider   sodium phosphate (FLEET) 7-19 GM/118ML enema 1 enema  1 enema Rectal Once Lahoma Pigg, MD    Review of Systems  Cardiovascular:  Positive for leg swelling.  All other systems reviewed and are negative.         Objective:     BP 121/67   Pulse 78   Temp (!) 97.3 F (36.3 C)   Ht 5' 9.5" (1.765 m)   Wt 295 lb  9.6 oz (134.1 kg)   SpO2 90%   BMI 43.03 kg/m  BP Readings from Last 3 Encounters:  09/28/23 121/67  06/11/23 135/82  03/30/23 122/72   Wt Readings from Last 3 Encounters:  09/28/23 295 lb 9.6 oz (134.1 kg)  06/11/23 290 lb (131.5 kg)  03/30/23 295 lb 6.4 oz (134 kg)   SpO2 Readings from Last 3 Encounters:  09/28/23 90%  06/11/23 94%  03/30/23 90%      Physical Exam Vitals and nursing note reviewed.  Constitutional:      General: He is not in acute distress.    Appearance: Normal appearance. He is morbidly obese. He is not ill-appearing, toxic-appearing or diaphoretic.  HENT:     Head: Normocephalic and atraumatic.     Right Ear: Tympanic membrane, ear canal and external ear normal.     Left Ear: Tympanic membrane, ear canal and external ear normal.     Nose: Nose normal.     Mouth/Throat:     Mouth: Mucous membranes are moist.     Pharynx: Oropharynx is clear.  Eyes:     Extraocular Movements: Extraocular movements intact.     Conjunctiva/sclera: Conjunctivae normal.     Pupils: Pupils are equal, round, and reactive to light.  Neck:     Thyroid : No thyroid  mass, thyromegaly or thyroid  tenderness.     Vascular: No carotid bruit.     Trachea: Trachea and phonation normal.  Cardiovascular:     Rate and Rhythm: Normal rate and regular rhythm.  Abdominal:     General: Abdomen is protuberant. Bowel sounds are normal.     Palpations: Abdomen is soft.     Tenderness: There is no abdominal tenderness.  Musculoskeletal:     Cervical back: Normal range of motion and neck supple.  Right lower leg: 1+ Edema present.     Left lower leg: 1+ Edema present.  Lymphadenopathy:     Cervical: No cervical adenopathy.  Skin:    General: Skin is warm and dry.     Capillary Refill: Capillary refill takes less than 2 seconds.  Neurological:     General: No focal deficit present.     Mental Status: He is alert and oriented to person, place, and time.  Psychiatric:        Mood  and Affect: Mood normal.        Behavior: Behavior normal.        Thought Content: Thought content normal.        Judgment: Judgment normal.      Last CBC Lab Results  Component Value Date   WBC 7.5 06/11/2023   HGB 16.3 06/11/2023   HCT 48.9 06/11/2023   MCV 89 06/11/2023   MCH 29.5 06/11/2023   RDW 12.0 06/11/2023   PLT 266 06/11/2023   Last metabolic panel Lab Results  Component Value Date   GLUCOSE 162 (H) 06/11/2023   NA 140 06/11/2023   K 4.0 06/11/2023   CL 97 06/11/2023   CO2 28 06/11/2023   BUN 20 06/11/2023   CREATININE 0.92 06/11/2023   EGFR 87 06/11/2023   CALCIUM 9.1 06/11/2023   PROT 6.8 06/11/2023   ALBUMIN 4.5 06/11/2023   LABGLOB 2.3 06/11/2023   AGRATIO 1.8 02/10/2022   BILITOT 0.5 06/11/2023   ALKPHOS 93 06/11/2023   AST 15 06/11/2023   ALT 8 06/11/2023   Last lipids Lab Results  Component Value Date   CHOL 115 06/11/2023   HDL 34 (L) 06/11/2023   LDLCALC 60 06/11/2023   TRIG 114 06/11/2023   CHOLHDL 3.4 06/11/2023   Last hemoglobin A1c Lab Results  Component Value Date   HGBA1C 6.4 (H) 03/30/2023   Last thyroid  functions Lab Results  Component Value Date   TSH 1.500 06/11/2023   T4TOTAL 9.7 06/11/2023       Assessment & Plan:    Routine Health Maintenance and Physical Exam  Immunization History  Administered Date(s) Administered   Fluad Quad(high Dose 65+) 01/19/2019, 01/23/2020, 02/07/2021, 02/10/2022   Influenza Split 04/27/2012, 01/30/2020   Influenza, High Dose Seasonal PF 03/08/2015, 02/19/2016, 01/27/2017, 03/07/2018   Influenza,trivalent, recombinat, inj, PF 03/06/2014   Influenza-Unspecified 03/06/2014, 01/26/2023   PFIZER(Purple Top)SARS-COV-2 Vaccination 05/17/2019, 06/07/2019, 03/06/2020, 05/12/2020   Pneumococcal Conjugate-13 03/08/2015   Pneumococcal Polysaccharide-23 04/27/2009, 02/19/2016   Tdap 01/03/2016   Zoster Recombinant(Shingrix ) 09/28/2023    Health Maintenance  Topic Date Due   Medicare Annual  Wellness (AWV)  11/13/2023   COVID-19 Vaccine (5 - 2024-25 season) 10/14/2023 (Originally 12/27/2022)   Colonoscopy  03/29/2024 (Originally 05/07/2020)   Zoster Vaccines- Shingrix  (2 of 2) 11/23/2023   INFLUENZA VACCINE  11/26/2023   DTaP/Tdap/Td (2 - Td or Tdap) 01/02/2026   Pneumonia Vaccine 55+ Years old  Completed   Hepatitis C Screening  Completed   HPV VACCINES  Aged Out   Meningococcal B Vaccine  Aged Out    Discussed health benefits of physical activity, and encouraged him to engage in regular exercise appropriate for his age and condition.  Problem List Items Addressed This Visit       Cardiovascular and Mediastinum   Essential (primary) hypertension   Relevant Medications   hydrochlorothiazide  (HYDRODIURIL ) 50 MG tablet   Other Relevant Orders   CBC with Differential/Platelet   CMP14+EGFR     Genitourinary  Malignant neoplasm of prostate (HCC)   Relevant Orders   PSA, total and free     Other   Mixed hyperlipidemia   Relevant Medications   hydrochlorothiazide  (HYDRODIURIL ) 50 MG tablet   Other Relevant Orders   CMP14+EGFR   Lipid panel   Morbid obesity with BMI of 40.0-44.9, adult (HCC)   Relevant Orders   CBC with Differential/Platelet   CMP14+EGFR   Lipid panel   Bayer DCA Hb A1c Waived   Prediabetes   Relevant Orders   CMP14+EGFR   Bayer DCA Hb A1c Waived   Other Visit Diagnoses       Annual physical exam    -  Primary   Relevant Orders   CBC with Differential/Platelet   CMP14+EGFR   Zoster Recombinant (Shingrix  ) (Completed)     Need for vaccination       Relevant Orders   Zoster Recombinant (Shingrix  ) (Completed)        Peripheral warmth sensation Reports a sensation of warmth in his legs, particularly noticeable at night, without associated numbness, tingling, or pain. This may be related to Norvasc  (amlodipine ), a vasodilator, which can increase blood flow to the extremities. Also experiences ankle swelling, which may contribute to the  sensation. Magnesium supplementation was suggested as a potential remedy. - Discontinue Norvasc  (amlodipine ). - Increase hydrochlorothiazide  to 50 mg daily. - Monitor blood pressure at home. - Try magnesium supplementation. - Follow up in 6-8 weeks to assess symptoms and blood pressure control.  Hypertension Hypertension is well-controlled on Norvasc  5 mg and hydrochlorothiazide  25 mg. Discontinuation of Norvasc  is due to suspected side effect of peripheral warmth sensation. Plan to increase hydrochlorothiazide  to manage blood pressure and swelling. Informed about the need to monitor blood pressure at home and report if it becomes too high. - Discontinue Norvasc  (amlodipine ). - Increase hydrochlorothiazide  to 50 mg daily. - Monitor blood pressure at home. - Follow up in 6-8 weeks to assess blood pressure control.  Prostate cancer Prostate cancer under regular surveillance with oncology every six months. Last PSA was 0.01, indicating good control. No urinary symptoms or weight changes reported. - Continue regular follow-up with oncology every six months. - Monitor for any new symptoms such as urinary changes or weight loss.  General Health Maintenance Due for Shingrix  vaccine. Colonoscopy screening discussed, with consideration of willingness to treat if findings are positive. Other vaccinations and screenings are up to date. Labs will be updated to monitor cholesterol, blood counts, electrolytes, kidney function, liver function, and A1c due to prediabetes. - Administer Shingrix  vaccine. - Update labs for cholesterol, blood counts, electrolytes, kidney function, liver function, and A1c. - Discuss colonoscopy screening based on willingness to treat if findings are positive.       Return in about 8 weeks (around 11/23/2023) for HTN.     Kattie Parrot, FNP

## 2023-09-29 ENCOUNTER — Telehealth: Payer: Self-pay

## 2023-09-29 ENCOUNTER — Ambulatory Visit: Payer: Self-pay | Admitting: Family Medicine

## 2023-09-29 DIAGNOSIS — E119 Type 2 diabetes mellitus without complications: Secondary | ICD-10-CM | POA: Insufficient documentation

## 2023-09-29 DIAGNOSIS — E1169 Type 2 diabetes mellitus with other specified complication: Secondary | ICD-10-CM

## 2023-09-29 LAB — CBC WITH DIFFERENTIAL/PLATELET
Basophils Absolute: 0.1 10*3/uL (ref 0.0–0.2)
Basos: 1 %
EOS (ABSOLUTE): 0.1 10*3/uL (ref 0.0–0.4)
Eos: 2 %
Hematocrit: 47 % (ref 37.5–51.0)
Hemoglobin: 15.8 g/dL (ref 13.0–17.7)
Immature Grans (Abs): 0.1 10*3/uL (ref 0.0–0.1)
Immature Granulocytes: 1 %
Lymphocytes Absolute: 1.4 10*3/uL (ref 0.7–3.1)
Lymphs: 17 %
MCH: 30.2 pg (ref 26.6–33.0)
MCHC: 33.6 g/dL (ref 31.5–35.7)
MCV: 90 fL (ref 79–97)
Monocytes Absolute: 0.7 10*3/uL (ref 0.1–0.9)
Monocytes: 8 %
Neutrophils Absolute: 5.9 10*3/uL (ref 1.4–7.0)
Neutrophils: 71 %
Platelets: 245 10*3/uL (ref 150–450)
RBC: 5.23 x10E6/uL (ref 4.14–5.80)
RDW: 12.3 % (ref 11.6–15.4)
WBC: 8.2 10*3/uL (ref 3.4–10.8)

## 2023-09-29 LAB — CMP14+EGFR
ALT: 11 IU/L (ref 0–44)
AST: 17 IU/L (ref 0–40)
Albumin: 4.2 g/dL (ref 3.8–4.8)
Alkaline Phosphatase: 84 IU/L (ref 44–121)
BUN/Creatinine Ratio: 22 (ref 10–24)
BUN: 19 mg/dL (ref 8–27)
Bilirubin Total: 0.6 mg/dL (ref 0.0–1.2)
CO2: 26 mmol/L (ref 20–29)
Calcium: 8.6 mg/dL (ref 8.6–10.2)
Chloride: 94 mmol/L — ABNORMAL LOW (ref 96–106)
Creatinine, Ser: 0.87 mg/dL (ref 0.76–1.27)
Globulin, Total: 2.1 g/dL (ref 1.5–4.5)
Glucose: 166 mg/dL — ABNORMAL HIGH (ref 70–99)
Potassium: 3.9 mmol/L (ref 3.5–5.2)
Sodium: 134 mmol/L (ref 134–144)
Total Protein: 6.3 g/dL (ref 6.0–8.5)
eGFR: 90 mL/min/{1.73_m2} (ref >59)

## 2023-09-29 LAB — LIPID PANEL
Cholesterol, Total: 106 mg/dL (ref 100–199)
HDL: 33 mg/dL — ABNORMAL LOW (ref >39)
LDL CALC COMMENT:: 3.2 ratio (ref 0.0–5.0)
LDL Chol Calc (NIH): 49 mg/dL (ref 0–99)
Triglycerides: 139 mg/dL (ref 0–149)
VLDL Cholesterol Cal: 24 mg/dL (ref 5–40)

## 2023-09-29 LAB — PSA, TOTAL AND FREE
PSA, Free Pct: 30 %
PSA, Free: 0.03 ng/mL
Prostate Specific Ag, Serum: 0.1 ng/mL (ref 0.0–4.0)

## 2023-09-29 MED ORDER — METFORMIN HCL 500 MG PO TABS
500.0000 mg | ORAL_TABLET | Freq: Two times a day (BID) | ORAL | 3 refills | Status: DC
Start: 2023-09-29 — End: 2024-01-19

## 2023-09-29 NOTE — Telephone Encounter (Signed)
 Copied from CRM [id number]. Topic: Clinical - Lab/Test Results >> Sep 29, 2023  4:42 PM Luane Rumps D wrote: Reason for CRM: Patient returning call for lab results. Patient has no further questions and he has seen the results through MyChart.

## 2023-09-29 NOTE — Telephone Encounter (Signed)
 Made a note in lab results. LS

## 2023-11-11 ENCOUNTER — Other Ambulatory Visit: Payer: Self-pay | Admitting: Family Medicine

## 2023-11-11 DIAGNOSIS — J302 Other seasonal allergic rhinitis: Secondary | ICD-10-CM

## 2023-11-13 ENCOUNTER — Other Ambulatory Visit: Payer: Self-pay | Admitting: Family Medicine

## 2023-11-13 DIAGNOSIS — I10 Essential (primary) hypertension: Secondary | ICD-10-CM

## 2023-11-15 ENCOUNTER — Ambulatory Visit: Payer: Medicare Other

## 2023-11-15 VITALS — BP 128/77 | Ht 69.0 in | Wt 295.0 lb

## 2023-11-15 DIAGNOSIS — Z0000 Encounter for general adult medical examination without abnormal findings: Secondary | ICD-10-CM

## 2023-11-15 NOTE — Progress Notes (Signed)
 Subjective:   Ronald Pitts is a 73 y.o. who presents for a Medicare Wellness preventive visit.  As a reminder, Annual Wellness Visits don't include a physical exam, and some assessments may be limited, especially if this visit is performed virtually. We may recommend an in-person follow-up visit with your provider if needed.  Visit Complete: Virtual I connected with  Ronald Pitts on 11/15/23 by a audio enabled telemedicine application and verified that I am speaking with the correct person using two identifiers.  Patient Location: Home  Provider Location: Home Office  I discussed the limitations of evaluation and management by telemedicine. The patient expressed understanding and agreed to proceed.  Vital Signs: Because this visit was a virtual/telehealth visit, some criteria may be missing or patient reported. Any vitals not documented were not able to be obtained and vitals that have been documented are patient reported.  VideoDeclined- This patient declined Librarian, academic. Therefore the visit was completed with audio only.  Persons Participating in Visit: Patient.  AWV Questionnaire: No: Patient Medicare AWV questionnaire was not completed prior to this visit.  Cardiac Risk Factors include: advanced age (>9men, >49 women);dyslipidemia;hypertension;obesity (BMI >30kg/m2)     Objective:    Today's Vitals   11/15/23 1123  BP: 128/77  Weight: 295 lb (133.8 kg)  Height: 5' 9 (1.753 m)   Body mass index is 43.56 kg/m.     11/15/2023   11:28 AM 11/13/2022    2:00 PM 06/19/2022    6:46 AM 05/04/2022    8:33 AM 11/10/2021   10:36 AM 11/07/2020   10:57 AM 01/02/2016    5:22 PM  Advanced Directives  Does Patient Have a Medical Advance Directive? No No Yes Yes Yes Yes Yes   Type of Surveyor, minerals;Living will Living will Healthcare Power of [employer];Living will Healthcare Power of [place];Living will Healthcare  Power of [place];Living will   Does patient want to make changes to medical advance directive?   No - Patient declined      Copy of Healthcare Power of Attorney in Chart?   No - copy requested  No - copy requested No - copy requested   Would patient like information on creating a medical advance directive?  No - Patient declined          Data saved with a previous flowsheet row definition    Current Medications (verified) Outpatient Encounter Medications as of 11/15/2023  Medication Sig   aspirin 81 MG tablet Take 81 mg by mouth daily.   Carboxymethylcellulose Sodium (DRY EYE RELIEF OP) Apply to eye as needed.   fluticasone  (FLONASE ) 50 MCG/ACT nasal spray SPRAY 2 SPRAYS INTO EACH NOSTRIL EVERY DAY   hydrochlorothiazide  (HYDRODIURIL ) 50 MG tablet Take 1 tablet (50 mg total) by mouth daily.   metFORMIN  (GLUCOPHAGE ) 500 MG tablet Take 1 tablet (500 mg total) by mouth 2 (two) times daily with a meal.   triamcinolone  cream (KENALOG ) 0.1 % APPLY TO AFFECTED AREA TWICE A DAY   Facility-Administered Encounter Medications as of 11/15/2023  Medication   sodium phosphate (FLEET) 7-19 GM/118ML enema 1 enema    Allergies (verified) Patient has no known allergies.   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   urologist--- dr pace/  radiation oncologsit--- dr patrcia---  dx 12/ 2023,  Gleason 4+3   OA (osteoarthritis)    Pre-diabetes    Wears partial dentures    upper and lower   Past Surgical History:  Procedure Laterality Date   COLONOSCOPY W/ POLYPECTOMY  05/08/2015   dr avram   FINGER SURGERY Right 1970   index  ,  repair amputation injury   GOLD SEED IMPLANT N/A 06/19/2022   Procedure: GOLD SEED IMPLANT;  Surgeon: Selma Donnice SAUNDERS, MD;  Location: [facility];  Service: Urology;  Laterality: N/A;   SPACE OAR  INSTILLATION N/A 06/19/2022   Procedure: SPACE OAR INSTILLATION;  Surgeon: Selma Donnice SAUNDERS, MD;  Location: [facility];  Service: Urology;  Laterality: N/A;   Family History  Problem Relation Age of Onset   Colon cancer Neg Hx    Social History   Socioeconomic History   Marital status: Married    Spouse name: [name]   Number of children: 2   Years of education: Not on file   Highest education level: 12th grade  Occupational History   Occupation: retired  Tobacco Use   Smoking status: Former    Current packs/day: 0.00    Types: Cigarettes    Start date: 1979    Quit date: 1999    Years since quitting: 26.5   Smokeless tobacco: Never  Vaping Use   Vaping status: Never Used  Substance and Sexual Activity   Alcohol use: Not Currently    Comment: occasional   Drug use: Never   Sexual activity: Not on file  Other Topics Concern   Not on file  Social History Narrative   One level living with wife, [name] - son lives in [place], daughter in [place]   Social Drivers of Health   Financial Resource Strain: Low Risk  (11/15/2023)   Overall Financial Resource Strain (CARDIA)    Difficulty of Paying Living Expenses: Not hard at all  Food Insecurity: No Food Insecurity (11/15/2023)   Hunger Vital Sign    Worried About Running Out of Food in the Last Year: Never true    Ran Out of Food in the Last Year: Never true  Transportation Needs: No Transportation Needs (08/17/2023)   PRAPARE - Administrator, Civil Service (Medical): No    Lack of Transportation (Non-Medical): No  Physical Activity: Insufficiently Active (11/15/2023)   Exercise Vital Sign    Days of Exercise per Week: 7 days    Minutes of Exercise per Session: 10 min  Stress: No Stress Concern Present (11/15/2023)   [employer] of Occupational Health - Occupational Stress Questionnaire    Feeling of Stress: Not at all  Social Connections: Moderately Integrated (11/15/2023)   Social  Connection and Isolation Panel    Frequency of Communication with Friends and Family: More than three times a week    Frequency of Social Gatherings with Friends and Family: More than three times a week    Attends Religious Services: 1 to 4 times per year    Active Member of [employer] or Organizations: No    Attends Engineer, structural: Never    Marital Status: Married    Tobacco Counseling Counseling given: Yes    Clinical Intake:  Pre-visit preparation completed: Yes  Pain : No/denies pain     BMI - recorded: 4356 Nutritional Status: BMI > 30  Obese Nutritional Risks: None Diabetes: No CBG done?: No  Lab Results  Component Value Date   HGBA1C 7.3 (H) 09/28/2023   HGBA1C 6.4 (  H) 03/30/2023   HGBA1C 6.4 (H) 02/10/2022     How often do you need to have someone help you when you read instructions, pamphlets, or other written materials from your doctor or pharmacy?: 1 - Never  Interpreter Needed?: No  Information entered by :: alia t/cma   Activities of Daily Living     11/15/2023   11:26 AM  In your present state of health, do you have any difficulty performing the following activities:  Hearing? 0  Vision? 0  Difficulty concentrating or making decisions? 0  Walking or climbing stairs? 0  Dressing or bathing? 0  Doing errands, shopping? 0  Preparing Food and eating ? N  Using the Toilet? N  In the past six months, have you accidently leaked urine? N  Do you have problems with loss of bowel control? N  Managing your Medications? N  Managing your Finances? N  Housekeeping or managing your Housekeeping? N    Patient Care Team: Severa Rock HERO, FNP as PCP - General (Family Medicine) Vicci Mcardle, [place] (Optometry) Selma Donnice SAUNDERS, MD as Consulting Physician (Urology) Patrcia Donnice, MD as Consulting Physician (Radiation Oncology)  I have updated your Care Teams any recent Medical Services you may have received from other providers in the past  year.     Assessment:   This is a routine wellness examination for [employer].  Hearing/Vision screen Hearing Screening - Comments:: Pt denies hearing dif Vision Screening - Comments:: Pt denies vision dif/Pt goes to [facility] in [place]/last ov 2mos ago   Goals Addressed             This Visit's Progress    Patient Stated   On track    Wants to stay healthy       Depression Screen     11/15/2023   11:30 AM 09/28/2023    8:34 AM 06/11/2023   10:45 AM 03/30/2023    8:39 AM 11/13/2022    2:01 PM 09/22/2022    8:55 AM 02/10/2022    8:56 AM  PHQ 2/9 Scores  PHQ - 2 Score 0 0 0 0 0 0 0  PHQ- 9 Score 0 0 0 5 0 3 0    Fall Risk     11/15/2023   11:24 AM 09/28/2023    8:34 AM 06/11/2023   10:45 AM 03/30/2023    8:39 AM 11/13/2022    2:00 PM  Fall Risk   Falls in the past year? 0 0 0 0 0  Number falls in past yr: 0 0   0  Injury with Fall? 0 0   0  Risk for fall due to : No Fall Risks No Fall Risks No Fall Risks  No Fall Risks  Follow up Falls evaluation completed Falls evaluation completed Falls evaluation completed  Falls prevention discussed    MEDICARE RISK AT HOME:  Medicare Risk at Home Any stairs in or around the home?: No If so, are there any without handrails?: No Home free of loose throw rugs in walkways, pet beds, electrical cords, etc?: Yes Adequate lighting in your home to reduce risk of falls?: Yes Life alert?: Yes Use of a cane, walker or w/c?: No Grab bars in the bathroom?: Yes Shower chair or bench in shower?: Yes Elevated toilet seat or a handicapped toilet?: Yes  TIMED UP AND GO:  Was the test performed?  no  Cognitive Function: 6CIT completed        11/15/2023   11:31 AM  11/13/2022    2:00 PM 11/10/2021   10:37 AM 11/07/2020   10:57 AM  6CIT Screen  What Year? 0 points 0 points 0 points 0 points  What month? 0 points 0 points 0 points 0 points  What time? 0 points 0 points 0 points 0 points  Count back from 20 0 points 0 points 0 points 0  points  Months in reverse 0 points 0 points 0 points 0 points  Repeat phrase 0 points 0 points 0 points 0 points  Total Score 0 points 0 points 0 points 0 points    Immunizations Immunization History  Administered Date(s) Administered   Fluad Quad(high Dose 65+) 01/19/2019, 01/23/2020, 02/07/2021, 02/10/2022   Influenza Split 04/27/2012, 01/30/2020   Influenza, High Dose Seasonal PF 03/08/2015, 02/19/2016, 01/27/2017, 03/07/2018   Influenza,trivalent, recombinat, inj, PF 03/06/2014   Influenza-Unspecified 03/06/2014, 01/26/2023   PFIZER(Purple Top)SARS-COV-2 Vaccination 05/17/2019, 06/07/2019, 03/06/2020, 05/12/2020   Pneumococcal Conjugate-13 03/08/2015   Pneumococcal Polysaccharide-23 04/27/2009, 02/19/2016   Tdap 01/03/2016   Zoster Recombinant(Shingrix ) 09/28/2023    Screening Tests Health Maintenance  Topic Date Due   FOOT EXAM  Never done   Diabetic kidney evaluation - Urine ACR  02/11/2023   Colonoscopy  03/29/2024 (Originally 05/07/2020)   COVID-19 Vaccine (5 - 2024-25 season) 11/30/2024 (Originally 12/27/2022)   Zoster Vaccines- Shingrix  (2 of 2) 11/23/2023   INFLUENZA VACCINE  11/26/2023   HEMOGLOBIN A1C  03/29/2024   OPHTHALMOLOGY EXAM  06/09/2024   Diabetic kidney evaluation - eGFR measurement  09/27/2024   Medicare Annual Wellness (AWV)  11/14/2024   DTaP/Tdap/Td (2 - Td or Tdap) 01/02/2026   Pneumococcal Vaccine: 50+ Years  Completed   Hepatitis C Screening  Completed   Hepatitis B Vaccines  Aged Out   HPV VACCINES  Aged Out   Meningococcal B Vaccine  Aged Out    Health Maintenance  Health Maintenance Due  Topic Date Due   FOOT EXAM  Never done   Diabetic kidney evaluation - Urine ACR  02/11/2023   Health Maintenance Items Addressed: See Nurse Notes at the end of this note  Additional Screening:  Vision Screening: Recommended annual ophthalmology exams for early detection of glaucoma and other disorders of the eye. Would you like a referral to an eye  doctor? No    Dental Screening: Recommended annual dental exams for proper oral hygiene  Community Resource Referral / Chronic Care Management: CRR required this visit?  No   CCM required this visit?  No   Plan:    I have personally reviewed and noted the following in the patient's chart:   Medical and social history Use of alcohol, tobacco or illicit drugs  Current medications and supplements including opioid prescriptions. Patient is not currently taking opioid prescriptions. Functional ability and status Nutritional status Physical activity Advanced directives List of other physicians Hospitalizations, surgeries, and ER visits in previous 12 months Vitals Screenings to include cognitive, depression, and falls Referrals and appointments  In addition, I have reviewed and discussed with patient certain preventive protocols, quality metrics, and best practice recommendations. A written personalized care plan for preventive services as well as general preventive health recommendations were provided to patient.   Ozie Ned, CMA   11/15/2023   After Visit Summary: (MyChart) Due to this being a telephonic visit, the after visit summary with patients personalized plan was offered to patient via MyChart   Notes: Nothing significant to report at this time.

## 2023-11-15 NOTE — Patient Instructions (Signed)
 Mr. Ronald Pitts , Thank you for taking time out of your busy schedule to complete your Annual Wellness Visit with me. I enjoyed our conversation and look forward to speaking with you again next year. I, as well as your care team,  appreciate your ongoing commitment to your health goals. Please review the following plan we discussed and let me know if I can assist you in the future. Your Game plan/ To Do List    Follow up Visits: Next Medicare AWV with our clinical staff: 11/15/24 at 11:20a.m.   Next Office Visit with your provider: 12/08/23 at 8:20a.m.  Clinician Recommendations:  Aim for 30 minutes of exercise or brisk walking, 6-8 glasses of water, and 5 servings of fruits and vegetables each day.       This is a list of the screening recommended for you and due dates:  Health Maintenance  Topic Date Due   Complete foot exam   Never done   Yearly kidney health urinalysis for diabetes  02/11/2023   Medicare Annual Wellness Visit  11/13/2023   Colon Cancer Screening  03/29/2024*   COVID-19 Vaccine (5 - 2024-25 season) 11/30/2024*   Zoster (Shingles) Vaccine (2 of 2) 11/23/2023   Flu Shot  11/26/2023   Hemoglobin A1C  03/29/2024   Eye exam for diabetics  06/09/2024   Yearly kidney function blood test for diabetes  09/27/2024   DTaP/Tdap/Td vaccine (2 - Td or Tdap) 01/02/2026   Pneumococcal Vaccine for age over 21  Completed   Hepatitis C Screening  Completed   Hepatitis B Vaccine  Aged Out   HPV Vaccine  Aged Out   Meningitis B Vaccine  Aged Out  *Topic was postponed. The date shown is not the original due date.    Advanced directives: (Declined) Advance directive discussed with you today. Even though you declined this today, please call our office should you change your mind, and we can give you the proper paperwork for you to fill out. Advance Care Planning is important because it:  [x]  Makes sure you receive the medical care that is consistent with your values, goals, and  preferences  [x]  It provides guidance to your family and loved ones and reduces their decisional burden about whether or not they are making the right decisions based on your wishes.  Follow the link provided in your after visit summary or read over the paperwork we have mailed to you to help you started getting your Advance Directives in place. If you need assistance in completing these, please reach out to us  so that we can help you!  See attachments for Preventive Care and Fall Prevention Tips.

## 2023-12-08 ENCOUNTER — Encounter: Payer: Self-pay | Admitting: Family Medicine

## 2023-12-08 ENCOUNTER — Ambulatory Visit (INDEPENDENT_AMBULATORY_CARE_PROVIDER_SITE_OTHER): Admitting: Family Medicine

## 2023-12-08 VITALS — BP 145/84 | HR 73 | Temp 97.0°F | Ht 69.0 in | Wt 289.6 lb

## 2023-12-08 DIAGNOSIS — Z7984 Long term (current) use of oral hypoglycemic drugs: Secondary | ICD-10-CM

## 2023-12-08 DIAGNOSIS — E1159 Type 2 diabetes mellitus with other circulatory complications: Secondary | ICD-10-CM

## 2023-12-08 DIAGNOSIS — I152 Hypertension secondary to endocrine disorders: Secondary | ICD-10-CM

## 2023-12-08 DIAGNOSIS — Z23 Encounter for immunization: Secondary | ICD-10-CM

## 2023-12-08 NOTE — Progress Notes (Signed)
 Subjective:  Patient ID: Ronald Pitts, male    DOB: 1950-10-13, 73 y.o.   MRN: [id number]  Patient Care Team: Severa Rock HERO, FNP as PCP - General (Family Medicine) Vicci Mcardle, [place] (Optometry) Selma Donnice SAUNDERS, MD as Consulting Physician (Urology) Patrcia Donnice, MD as Consulting Physician (Radiation Oncology)   Chief Complaint:  Hypertension (8 week follow up )   HPI: Ronald Pitts is a 73 y.o. male presenting on 12/08/2023 for Hypertension (8 week follow up )   Ronald Pitts is a 73 year old male with hypertension and diabetes who presents for follow-up regarding medication adjustments and leg swelling.  He has been taking hydrochlorothiazide  after discontinuing amlodipine  but has not observed any improvement in his leg swelling. He does not use compression socks or elevate his legs regularly. His blood pressure readings at home have been stable, with values around 130/70 and 120/70. No leg pain, heaviness, or aching during activities.  He experiences frequent sweating, which he describes as 'sweat real bad,' causing his hair to get wet and drip. This occurs often but not constantly. He uses a towel to manage the sweating. He is currently taking metformin  twice a day. No additional symptoms such as fever or chills accompany the sweating.       Relevant past medical, surgical, family, and social history reviewed and updated as indicated.  Allergies and medications reviewed and updated. Data reviewed: Chart in Epic.   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   urologist--- dr pace/  radiation oncologsit--- dr patrcia---  dx 12/ 2023,  Gleason 4+3   OA (osteoarthritis)    Pre-diabetes    Wears partial dentures    upper and lower    Past Surgical History:  Procedure Laterality Date   COLONOSCOPY W/ POLYPECTOMY  05/08/2015    dr avram   FINGER SURGERY Right 1970   index  ,  repair amputation injury   GOLD SEED IMPLANT N/A 06/19/2022   Procedure: GOLD SEED IMPLANT;  Surgeon: Selma Donnice SAUNDERS, MD;  Location: [facility];  Service: Urology;  Laterality: N/A;   SPACE OAR INSTILLATION N/A 06/19/2022   Procedure: SPACE OAR INSTILLATION;  Surgeon: Selma Donnice SAUNDERS, MD;  Location: [facility];  Service: Urology;  Laterality: N/A;    Social History   Socioeconomic History   Marital status: Married    Spouse name: [name]   Number of children: 2   Years of education: Not on file   Highest education level: Some college, no degree  Occupational History   Occupation: retired  Tobacco Use   Smoking status: Former    Current packs/day: 0.00    Types: Cigarettes    Start date: 1979    Quit date: 1999    Years since quitting: 26.6   Smokeless tobacco: Never  Vaping Use   Vaping status: Never Used  Substance and Sexual Activity   Alcohol use: Not Currently    Comment: occasional   Drug use: Never   Sexual activity: Not on file  Other Topics Concern   Not on file  Social History Narrative   One level living with wife, [name] - son lives in [place], daughter in [place]   Social Drivers of Health   Financial Resource Strain: Low Risk  (12/07/2023)   Overall Financial Resource Strain (CARDIA)  Difficulty of Paying Living Expenses: Not hard at all  Food Insecurity: No Food Insecurity (12/07/2023)   Hunger Vital Sign    Worried About Running Out of Food in the Last Year: Never true    Ran Out of Food in the Last Year: Never true  Transportation Needs: No Transportation Needs (12/07/2023)   PRAPARE - Administrator, Civil Service (Medical): No    Lack of Transportation (Non-Medical): No  Physical Activity: Inactive (12/07/2023)   Exercise Vital Sign    Days of Exercise per Week: 0 days    Minutes of Exercise per Session: Not on file  Stress: No Stress Concern Present  (12/07/2023)   [employer] of Occupational Health - Occupational Stress Questionnaire    Feeling of Stress: Not at all  Social Connections: Moderately Integrated (12/07/2023)   Social Connection and Isolation Panel    Frequency of Communication with Friends and Family: More than three times a week    Frequency of Social Gatherings with Friends and Family: More than three times a week    Attends Religious Services: 1 to 4 times per year    Active Member of [employer] or Organizations: No    Attends Banker Meetings: Not on file    Marital Status: Married  Catering manager Violence: Not At Risk (11/15/2023)   Humiliation, Afraid, Rape, and Kick questionnaire    Fear of Current or Ex-Partner: No    Emotionally Abused: No    Physically Abused: No    Sexually Abused: No    Outpatient Encounter Medications as of 12/08/2023  Medication Sig   aspirin 81 MG tablet Take 81 mg by mouth daily.   fluticasone  (FLONASE ) 50 MCG/ACT nasal spray SPRAY 2 SPRAYS INTO EACH NOSTRIL EVERY DAY   hydrochlorothiazide  (HYDRODIURIL ) 50 MG tablet Take 1 tablet (50 mg total) by mouth daily.   metFORMIN  (GLUCOPHAGE ) 500 MG tablet Take 1 tablet (500 mg total) by mouth 2 (two) times daily with a meal.   triamcinolone  cream (KENALOG ) 0.1 % APPLY TO AFFECTED AREA TWICE A DAY   [DISCONTINUED] Carboxymethylcellulose Sodium (DRY EYE RELIEF OP) Apply to eye as needed.   Facility-Administered Encounter Medications as of 12/08/2023  Medication   sodium phosphate (FLEET) 7-19 GM/118ML enema 1 enema    No Known Allergies  Pertinent ROS per HPI, otherwise unremarkable      Objective:  BP (!) 145/84   Pulse 73   Temp (!) 97 F (36.1 C)   Ht 5' 9 (1.753 m)   Wt 289 lb 9.6 oz (131.4 kg)   SpO2 91%   BMI 42.77 kg/m    Wt Readings from Last 3 Encounters:  12/08/23 289 lb 9.6 oz (131.4 kg)  11/15/23 295 lb (133.8 kg)  09/28/23 295 lb 9.6 oz (134.1 kg)    Physical Exam Vitals and nursing note  reviewed.  Constitutional:      Appearance: Normal appearance. He is well-developed and well-groomed. He is morbidly obese.  HENT:     Head: Normocephalic and atraumatic.     Mouth/Throat:     Mouth: Mucous membranes are moist.  Eyes:     Conjunctiva/sclera: Conjunctivae normal.     Pupils: Pupils are equal, round, and reactive to light.  Cardiovascular:     Rate and Rhythm: Normal rate and regular rhythm.     Heart sounds: Normal heart sounds.  Pulmonary:     Effort: Pulmonary effort is normal.     Breath sounds: Normal breath sounds.  Musculoskeletal:     Right lower leg: 1+ Edema present.     Left lower leg: 1+ Edema present.  Skin:    General: Skin is warm and dry.     Capillary Refill: Capillary refill takes less than 2 seconds.  Neurological:     General: No focal deficit present.     Mental Status: He is alert and oriented to person, place, and time.  Psychiatric:        Mood and Affect: Mood normal.        Behavior: Behavior normal. Behavior is cooperative.        Thought Content: Thought content normal.        Judgment: Judgment normal.     Results for orders placed or performed in visit on 09/28/23  Bayer DCA Hb A1c Waived   Collection Time: 09/28/23  8:50 AM  Result Value Ref Range   HB A1C (BAYER DCA - WAIVED) 7.3 (H) 4.8 - 5.6 %  CBC with Differential/Platelet   Collection Time: 09/28/23  8:52 AM  Result Value Ref Range   WBC 8.2 3.4 - 10.8 x10E3/uL   RBC 5.23 4.14 - 5.80 x10E6/uL   Hemoglobin 15.8 13.0 - 17.7 g/dL   Hematocrit 52.9 62.4 - 51.0 %   MCV 90 79 - 97 fL   MCH 30.2 26.6 - 33.0 pg   MCHC 33.6 31.5 - 35.7 g/dL   RDW 87.6 88.3 - 84.5 %   Platelets 245 150 - 450 x10E3/uL   Neutrophils 71 Not Estab. %   Lymphs 17 Not Estab. %   Monocytes 8 Not Estab. %   Eos 2 Not Estab. %   Basos 1 Not Estab. %   Neutrophils Absolute 5.9 1.4 - 7.0 x10E3/uL   Lymphocytes Absolute 1.4 0.7 - 3.1 x10E3/uL   Monocytes Absolute 0.7 0.1 - 0.9 x10E3/uL   EOS  (ABSOLUTE) 0.1 0.0 - 0.4 x10E3/uL   Basophils Absolute 0.1 0.0 - 0.2 x10E3/uL   Immature Granulocytes 1 Not Estab. %   Immature Grans (Abs) 0.1 0.0 - 0.1 x10E3/uL  CMP14+EGFR   Collection Time: 09/28/23  8:52 AM  Result Value Ref Range   Glucose 166 (H) 70 - 99 mg/dL   BUN 19 8 - 27 mg/dL   Creatinine, Ser 9.12 0.76 - 1.27 mg/dL   eGFR 90 >40 fO/fpw/8.26   BUN/Creatinine Ratio 22 10 - 24   Sodium 134 134 - 144 mmol/L   Potassium 3.9 3.5 - 5.2 mmol/L   Chloride 94 (L) 96 - 106 mmol/L   CO2 26 20 - 29 mmol/L   Calcium 8.6 8.6 - 10.2 mg/dL   Total Protein 6.3 6.0 - 8.5 g/dL   Albumin 4.2 3.8 - 4.8 g/dL   Globulin, Total 2.1 1.5 - 4.5 g/dL   Bilirubin Total 0.6 0.0 - 1.2 mg/dL   Alkaline Phosphatase 84 44 - 121 IU/L   AST 17 0 - 40 IU/L   ALT 11 0 - 44 IU/L  Lipid panel   Collection Time: 09/28/23  8:52 AM  Result Value Ref Range   Cholesterol, Total 106 100 - 199 mg/dL   Triglycerides 860 0 - 149 mg/dL   HDL 33 (L) >60 mg/dL   VLDL Cholesterol Cal 24 5 - 40 mg/dL   LDL Chol Calc (NIH) 49 0 - 99 mg/dL   Chol/HDL Ratio 3.2 0.0 - 5.0 ratio  PSA, total and free   Collection Time: 09/28/23  8:52 AM  Result Value Ref Range  Prostate Specific Ag, Serum 0.1 0.0 - 4.0 ng/mL   PSA, Free 0.03 N/A ng/mL   PSA, Free Pct 30.0 %       Pertinent labs & imaging results that were available during my care of the patient were reviewed by me and considered in my medical decision making.  Assessment & Plan:  Andree was seen today for hypertension.  Diagnoses and all orders for this visit:  Hypertension associated with diabetes (HCC) -     BMP8+EGFR  Need for vaccination -     Varicella-zoster vaccine IM      Lower extremity edema Chronic lower extremity edema with non-compliance to compression socks and leg elevation. Anticipated improvement with continued diuretic use and lifestyle modifications. - Continue hydrochlorothiazide  50 mg daily - Encourage use of compression socks -  Advise leg elevation when possible  Hypertension Hypertension is well-controlled with current medication regimen. Home blood pressure readings are within target range. Recent increase in hydrochlorothiazide  dosage has not caused any adverse effects. - Continue current antihypertensive regimen - Scan home blood pressure readings into chart  Type 2 diabetes mellitus Type 2 diabetes mellitus managed with metformin . Frequent sweating likely due to metformin , with no other associated symptoms. - Schedule follow-up appointment for diabetes check at the end of September or beginning of October - Plan to recheck A1c during follow-up visit  General Health Maintenance Due for shingles vaccination. Monitoring kidney function due to increased diuretic use. - Administer shingles vaccine today - Perform BMP today to check kidney function          Continue all other maintenance medications.  Follow up plan: No follow-ups on file.   Continue healthy lifestyle choices, including diet (rich in fruits, vegetables, and lean proteins, and low in salt and simple carbohydrates) and exercise (at least 30 minutes of moderate physical activity daily).  Educational handout given for DASH  The above assessment and management plan was discussed with the patient. The patient verbalized understanding of and has agreed to the management plan. Patient is aware to call the clinic if they develop any new symptoms or if symptoms persist or worsen. Patient is aware when to return to the clinic for a follow-up visit. Patient educated on when it is appropriate to go to the emergency department.   Rosaline Bruns, FNP-C [facility] [phone]

## 2023-12-08 NOTE — Patient Instructions (Signed)

## 2023-12-09 ENCOUNTER — Ambulatory Visit: Payer: Self-pay | Admitting: Family Medicine

## 2023-12-09 LAB — BMP8+EGFR
BUN/Creatinine Ratio: 21 (ref 10–24)
BUN: 20 mg/dL (ref 8–27)
CO2: 30 mmol/L — ABNORMAL HIGH (ref 20–29)
Calcium: 9.3 mg/dL (ref 8.6–10.2)
Chloride: 95 mmol/L — ABNORMAL LOW (ref 96–106)
Creatinine, Ser: 0.97 mg/dL (ref 0.76–1.27)
Glucose: 150 mg/dL — ABNORMAL HIGH (ref 70–99)
Potassium: 4.4 mmol/L (ref 3.5–5.2)
Sodium: 138 mmol/L (ref 134–144)
eGFR: 81 mL/min/1.73 (ref >59)

## 2024-01-18 ENCOUNTER — Encounter: Payer: Self-pay | Admitting: Family Medicine

## 2024-01-19 ENCOUNTER — Ambulatory Visit (INDEPENDENT_AMBULATORY_CARE_PROVIDER_SITE_OTHER): Admitting: Family Medicine

## 2024-01-19 ENCOUNTER — Encounter: Payer: Self-pay | Admitting: Family Medicine

## 2024-01-19 ENCOUNTER — Ambulatory Visit: Payer: Self-pay | Admitting: Family Medicine

## 2024-01-19 VITALS — BP 124/75 | HR 71 | Temp 97.9°F | Ht 69.0 in | Wt 285.4 lb

## 2024-01-19 DIAGNOSIS — Z7984 Long term (current) use of oral hypoglycemic drugs: Secondary | ICD-10-CM

## 2024-01-19 DIAGNOSIS — L308 Other specified dermatitis: Secondary | ICD-10-CM | POA: Diagnosis not present

## 2024-01-19 DIAGNOSIS — R21 Rash and other nonspecific skin eruption: Secondary | ICD-10-CM

## 2024-01-19 DIAGNOSIS — E1169 Type 2 diabetes mellitus with other specified complication: Secondary | ICD-10-CM

## 2024-01-19 DIAGNOSIS — C61 Malignant neoplasm of prostate: Secondary | ICD-10-CM

## 2024-01-19 DIAGNOSIS — R202 Paresthesia of skin: Secondary | ICD-10-CM | POA: Diagnosis not present

## 2024-01-19 DIAGNOSIS — Z6841 Body Mass Index (BMI) 40.0 and over, adult: Secondary | ICD-10-CM

## 2024-01-19 LAB — BAYER DCA HB A1C WAIVED: HB A1C (BAYER DCA - WAIVED): 6.4 % — ABNORMAL HIGH (ref 4.8–5.6)

## 2024-01-19 MED ORDER — TRIAMCINOLONE ACETONIDE 0.1 % EX CREA: TOPICAL_CREAM | Freq: Two times a day (BID) | CUTANEOUS | 0 refills | Status: DC

## 2024-01-19 NOTE — Patient Instructions (Addendum)
 CeraVe, Cetaphil, Eucerin   Nervive   Continue to monitor your blood sugars as we discussed and record them. Bring the log to your next appointment.  Take your medications as directed.    Goal Blood glucose:    Fasting (before meals) = 80 to 130   Within 2 hours of eating = less than 180   Understanding your Hemoglobin A1c:6.4     Diabetes Mellitus and Nutrition    I think that you would greatly benefit from seeing a nutritionist. If this is something you are interested in, please call Dr Wonda at [phone] to schedule an appointment.   When you have diabetes (diabetes mellitus), it is very important to have healthy eating habits because your blood sugar (glucose) levels are greatly affected by what you eat and drink. Eating healthy foods in the appropriate amounts, at about the same times every day, can help you: Control your blood glucose. Lower your risk of heart disease. Improve your blood pressure. Reach or maintain a healthy weight.  Every person with diabetes is different, and each person has different needs for a meal plan. Your health care provider may recommend that you work with a diet and nutrition specialist (dietitian) to make a meal plan that is best for you. Your meal plan may vary depending on factors such as: The calories you need. The medicines you take. Your weight. Your blood glucose, blood pressure, and cholesterol levels. Your activity level. Other health conditions you have, such as heart or kidney disease.  How do carbohydrates affect me? Carbohydrates affect your blood glucose level more than any other type of food. Eating carbohydrates naturally increases the amount of glucose in your blood. Carbohydrate counting is a method for keeping track of how many carbohydrates you eat. Counting carbohydrates is important to keep your blood glucose at a healthy level, especially if you use insulin or take certain oral diabetes medicines. It is important to  know how many carbohydrates you can safely have in each meal. This is different for every person. Your dietitian can help you calculate how many carbohydrates you should have at each meal and for snack. Foods that contain carbohydrates include: Bread, cereal, rice, pasta, and crackers. Potatoes and corn. Peas, beans, and lentils. Milk and yogurt. Fruit and juice. Desserts, such as cakes, cookies, ice cream, and candy.  How does alcohol affect me? Alcohol can cause a sudden decrease in blood glucose (hypoglycemia), especially if you use insulin or take certain oral diabetes medicines. Hypoglycemia can be a life-threatening condition. Symptoms of hypoglycemia (sleepiness, dizziness, and confusion) are similar to symptoms of having too much alcohol. If your health care provider says that alcohol is safe for you, follow these guidelines: Limit alcohol intake to no more than 1 drink per day for nonpregnant women and 2 drinks per day for men. One drink equals 12 oz of beer, 5 oz of wine, or 1 oz of hard liquor. Do not drink on an empty stomach. Keep yourself hydrated with water, diet soda, or unsweetened iced tea. Keep in mind that regular soda, juice, and other mixers may contain a lot of sugar and must be counted as carbohydrates.  What are tips for following this plan?  Reading food labels Start by checking the serving size on the label. The amount of calories, carbohydrates, fats, and other nutrients listed on the label are based on one serving of the food. Many foods contain more than one serving per package. Check the total grams (g) of  carbohydrates in one serving. You can calculate the number of servings of carbohydrates in one serving by dividing the total carbohydrates by 15. For example, if a food has 30 g of total carbohydrates, it would be equal to 2 servings of carbohydrates. Check the number of grams (g) of saturated and trans fats in one serving. Choose foods that have low or no  amount of these fats. Check the number of milligrams (mg) of sodium in one serving. Most people should limit total sodium intake to less than 2,300 mg per day. Always check the nutrition information of foods labeled as low-fat or nonfat. These foods may be higher in added sugar or refined carbohydrates and should be avoided. Talk to your dietitian to identify your daily goals for nutrients listed on the label.  Shopping Avoid buying canned, premade, or processed foods. These foods tend to be high in fat, sodium, and added sugar. Shop around the outside edge of the grocery store. This includes fresh fruits and vegetables, bulk grains, fresh meats, and fresh dairy.  Cooking Use low-heat cooking methods, such as baking, instead of high-heat cooking methods like deep frying. Cook using healthy oils, such as olive, canola, or sunflower oil. Avoid cooking with butter, cream, or high-fat meats.  Meal planning Eat meals and snacks regularly, preferably at the same times every day. Avoid going long periods of time without eating. Eat foods high in fiber, such as fresh fruits, vegetables, beans, and whole grains. Talk to your dietitian about how many servings of carbohydrates you can eat at each meal. Eat 4-6 ounces of lean protein each day, such as lean meat, chicken, fish, eggs, or tofu. 1 ounce is equal to 1 ounce of meat, chicken, or fish, 1 egg, or 1/4 cup of tofu. Eat some foods each day that contain healthy fats, such as avocado, nuts, seeds, and fish.  Lifestyle  Check your blood glucose regularly. Exercise at least 30 minutes 5 or more days each week, or as told by your health care provider. Take medicines as told by your health care provider. Do not use any products that contain nicotine or tobacco, such as cigarettes and e-cigarettes. If you need help quitting, ask your health care provider. Work with a Veterinary surgeon or diabetes educator to identify strategies to manage stress and any  emotional and social challenges.  What are some questions to ask my health care provider? Do I need to meet with a diabetes educator? Do I need to meet with a dietitian? What number can I call if I have questions? When are the best times to check my blood glucose?  Where to find more information: American Diabetes Association: diabetes.org/food-and-fitness/food Academy of Nutrition and Dietetics: https://www.vargas.com/ [employer] of Diabetes and Digestive and Kidney Diseases (NIH): FindJew[employer].cz  Summary A healthy meal plan will help you control your blood glucose and maintain a healthy lifestyle. Working with a diet and nutrition specialist (dietitian) can help you make a meal plan that is best for you. Keep in mind that carbohydrates and alcohol have immediate effects on your blood glucose levels. It is important to count carbohydrates and to use alcohol carefully. This information is not intended to replace advice given to you by your health care provider. Make sure you discuss any questions you have with your health care provider. Document Released: 01/08/2005 Document Revised: 05/18/2016 Document Reviewed: 05/18/2016 Elsevier Interactive Patient Education  [employer].

## 2024-01-19 NOTE — Progress Notes (Signed)
 Subjective:  Patient ID: Ronald Pitts, male    DOB: [birth year], 73 y.o.   MRN: [id number]  Patient Care Team: Severa Rock HERO, FNP as PCP - General (Family Medicine) Vicci Mcardle, [place] (Optometry) Selma Donnice SAUNDERS, MD as Consulting Physician (Urology) Patrcia Donnice, MD as Consulting Physician (Radiation Oncology)   Chief Complaint:  Diabetes (3 month follow up) and Rash (Abd x 5-6 days )   HPI: Ronald Pitts is a 73 y.o. male presenting on 01/19/2024 for Diabetes (3 month follow up) and Rash (Abd x 5-6 days )   Ronald Pitts is a 73 year old male with diabetes who presents with side effects from metformin .  He has been experiencing excessive sweating, gastrointestinal issues including diarrhea, and a rash on his abdomen since starting metformin . The rash appeared approximately five to six days ago, initially stinging and itching, but it no longer itches. He has been washing the area, applying alcohol, and using lotion. He also tried using Kenalog  cream on the rash without significant improvement.  He has a history of diabetes and was started on metformin  after his A1c increased to 7.3. He has been monitoring his diet and has lost some weight. No increased hunger, thirst, or urination. Sweating has worsened since starting metformin , occurring both day and night, and is described as 'running down my neck'.  He experiences numbness and tingling in his feet and hands, as well as leg cramps, which have worsened over the past few months. He reports that he thinks he has neuropathy, though he cannot pronounce the term. He is currently taking metformin , hydrochlorothiazide , aspirin, Flonase  as needed, and Kenalog  cream. No recent illness, runny nose, or headaches.  He sees a urologist regularly, with the last visit a week ago, and reports no significant changes in urination or treatment. He underwent radiation treatment for prostate issues.          Relevant past medical, surgical, family,  and social history reviewed and updated as indicated.  Allergies and medications reviewed and updated. Data reviewed: Chart in Epic.   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   urologist--- dr pace/  radiation oncologsit--- dr patrcia---  dx 12/ 2023,  Gleason 4+3   OA (osteoarthritis)    Pre-diabetes    Wears partial dentures    upper and lower    Past Surgical History:  Procedure Laterality Date   COLONOSCOPY W/ POLYPECTOMY  05/08/2015   dr avram   FINGER SURGERY Right 1970   index  ,  repair amputation injury   GOLD SEED IMPLANT N/A 06/19/2022   Procedure: GOLD SEED IMPLANT;  Surgeon: Selma Donnice SAUNDERS, MD;  Location[facility];  Service: Urology;  Laterality: N/A;   SPACE OAR INSTILLATION N/A 06/19/2022   Procedure: SPACE OAR INSTILLATION;  Surgeon: Selma Donnice SAUNDERS, MD;  Location: [facility];  Service: Urology;  Laterality: N/A;    Social History   Socioeconomic History   Marital status: Married    Spouse name: [name]   Number of children: 2   Years of education: Not on file   Highest education level: Some college, no degree  Occupational History   Occupation: retired  Tobacco Use   Smoking status: Former    Current packs/day: 0.00    Types: Cigarettes    Start date: 1979  Quit date: 22    Years since quitting: 26.7   Smokeless tobacco: Never  Vaping Use   Vaping status: Never Used  Substance and Sexual Activity   Alcohol use: Not Currently    Comment: occasional   Drug use: Never   Sexual activity: Not on file  Other Topics Concern   Not on file  Social History Narrative   One level living with wife, [name] - son lives in [place], daughter in [place]   Social Drivers of Health   Financial Resource Strain: Low Risk  (12/07/2023)   Overall Financial Resource Strain  (CARDIA)    Difficulty of Paying Living Expenses: Not hard at all  Food Insecurity: No Food Insecurity (12/07/2023)   Hunger Vital Sign    Worried About Running Out of Food in the Last Year: Never true    Ran Out of Food in the Last Year: Never true  Transportation Needs: No Transportation Needs (12/07/2023)   PRAPARE - Administrator, Civil Service (Medical): No    Lack of Transportation (Non-Medical): No  Physical Activity: Inactive (12/07/2023)   Exercise Vital Sign    Days of Exercise per Week: 0 days    Minutes of Exercise per Session: Not on file  Stress: No Stress Concern Present (12/07/2023)   [employer] of Occupational Health - Occupational Stress Questionnaire    Feeling of Stress: Not at all  Social Connections: Moderately Integrated (12/07/2023)   Social Connection and Isolation Panel    Frequency of Communication with Friends and Family: More than three times a week    Frequency of Social Gatherings with Friends and Family: More than three times a week    Attends Religious Services: 1 to 4 times per year    Active Member of [employer] or Organizations: No    Attends Banker Meetings: Not on file    Marital Status: Married  Catering manager Violence: Not At Risk (11/15/2023)   Humiliation, Afraid, Rape, and Kick questionnaire    Fear of Current or Ex-Partner: No    Emotionally Abused: No    Physically Abused: No    Sexually Abused: No    Outpatient Encounter Medications as of 01/19/2024  Medication Sig   aspirin 81 MG tablet Take 81 mg by mouth daily.   fluticasone  (FLONASE ) 50 MCG/ACT nasal spray SPRAY 2 SPRAYS INTO EACH NOSTRIL EVERY DAY   hydrochlorothiazide  (HYDRODIURIL ) 50 MG tablet Take 1 tablet (50 mg total) by mouth daily.   [DISCONTINUED] metFORMIN  (GLUCOPHAGE ) 500 MG tablet Take 1 tablet (500 mg total) by mouth 2 (two) times daily with a meal.   [DISCONTINUED] triamcinolone  cream (KENALOG ) 0.1 % APPLY TO AFFECTED AREA TWICE A DAY    triamcinolone  cream (KENALOG ) 0.1 % Apply topically 2 (two) times daily.   Facility-Administered Encounter Medications as of 01/19/2024  Medication   sodium phosphate (FLEET) 7-19 GM/118ML enema 1 enema    No Known Allergies  Pertinent ROS per HPI, otherwise unremarkable      Objective:  BP 124/75   Pulse 71   Temp 97.9 F (36.6 C)   Ht 5' 9 (1.753 m)   Wt 285 lb 6.4 oz (129.5 kg)   SpO2 94%   BMI 42.15 kg/m    Wt Readings from Last 3 Encounters:  01/19/24 285 lb 6.4 oz (129.5 kg)  12/08/23 289 lb 9.6 oz (131.4 kg)  11/15/23 295 lb (133.8 kg)    Physical Exam Vitals and nursing note reviewed.  Constitutional:  General: He is not in acute distress.    Appearance: Normal appearance. He is morbidly obese. He is not ill-appearing, toxic-appearing or diaphoretic.  HENT:     Head: Normocephalic and atraumatic.     Mouth/Throat:     Mouth: Mucous membranes are moist.  Eyes:     Conjunctiva/sclera: Conjunctivae normal.     Pupils: Pupils are equal, round, and reactive to light.  Cardiovascular:     Rate and Rhythm: Normal rate and regular rhythm.     Heart sounds: Normal heart sounds.  Musculoskeletal:     Right lower leg: No edema.     Left lower leg: No edema.  Skin:    General: Skin is warm and dry.     Capillary Refill: Capillary refill takes less than 2 seconds.     Findings: Rash (abdomen) present. Rash is papular.  Neurological:     General: No focal deficit present.     Mental Status: He is alert and oriented to person, place, and time.  Psychiatric:        Mood and Affect: Mood normal.        Behavior: Behavior normal. Behavior is cooperative.        Thought Content: Thought content normal.        Judgment: Judgment normal.     Results for orders placed or performed in visit on 12/08/23  BMP8+EGFR   Collection Time: 12/08/23  8:15 AM  Result Value Ref Range   Glucose 150 (H) 70 - 99 mg/dL   BUN 20 8 - 27 mg/dL   Creatinine, Ser 9.02 0.76 -  1.27 mg/dL   eGFR 81 >40 fO/fpw/8.26   BUN/Creatinine Ratio 21 10 - 24   Sodium 138 134 - 144 mmol/L   Potassium 4.4 3.5 - 5.2 mmol/L   Chloride 95 (L) 96 - 106 mmol/L   CO2 30 (H) 20 - 29 mmol/L   Calcium 9.3 8.6 - 10.2 mg/dL       Pertinent labs & imaging results that were available during my care of the patient were reviewed by me and considered in my medical decision making.  Assessment & Plan:  Derril was seen today for diabetes and rash.  Diagnoses and all orders for this visit:  Type 2 diabetes mellitus with other specified complication, without long-term current use of insulin (HCC) -     Bayer DCA Hb A1c Waived -     Microalbumin / creatinine urine ratio -     Anemia Profile B -     CMP14+EGFR  Paresthesia -     Anemia Profile B -     CMP14+EGFR  Pruritic dermatitis -     triamcinolone  cream (KENALOG ) 0.1 %; Apply topically 2 (two) times daily.  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 Experiencing adverse effects from metformin  including excessive sweating, diarrhea, and abdominal rash. Neuropathy symptoms include numbness, tingling, and leg cramps, possibly exacerbated by vitamin deficiencies due to metformin  use. - Discontinue metformin  due to adverse effects. - Monitor A1c in 3-4 months to assess need for alternative diabetes medication. - Order blood tests for B12, potassium, calcium, folate, and ferritin levels to evaluate for potential causes of neuropathy. -  Recommend over-the-counter Nervive for neuropathic pain management. - Check urine for protein levels.  Rash of abdomen Rash on abdomen developed 5-6 days ago, initially itchy and stinging, now non-itchy. Possibly related to metformin  use or a viral  etiology. Current treatment with triamcinolone  cream has been ineffective. - Prescribe additional triamcinolone  cream. - Instruct to mix triamcinolone  with Eucerin, Cerave, or Cetaphil lotion and apply twice daily to affected area. - Advise to report if rash does not improve with treatment.          Continue all other maintenance medications.  Follow up plan: Return in about 3 months (around 04/19/2024), or if symptoms worsen or fail to improve, for DM.   Continue healthy lifestyle choices, including diet (rich in fruits, vegetables, and lean proteins, and low in salt and simple carbohydrates) and exercise (at least 30 minutes of moderate physical activity daily).  Educational handout given for DM  The above assessment and management plan was discussed with the patient. The patient verbalized understanding of and has agreed to the management plan. Patient is aware to call the clinic if they develop any new symptoms or if symptoms persist or worsen. Patient is aware when to return to the clinic for a follow-up visit. Patient educated on when it is appropriate to go to the emergency department.   Rosaline Bruns, FNP-C [facility] [phone]

## 2024-01-20 ENCOUNTER — Telehealth: Payer: Self-pay | Admitting: Family Medicine

## 2024-01-20 DIAGNOSIS — I152 Hypertension secondary to endocrine disorders: Secondary | ICD-10-CM

## 2024-01-20 DIAGNOSIS — E1122 Type 2 diabetes mellitus with diabetic chronic kidney disease: Secondary | ICD-10-CM

## 2024-01-20 DIAGNOSIS — E1169 Type 2 diabetes mellitus with other specified complication: Secondary | ICD-10-CM

## 2024-01-20 LAB — ANEMIA PROFILE B
Basophils Absolute: 0.1 x10E3/uL (ref 0.0–0.2)
Basos: 1 %
EOS (ABSOLUTE): 0.1 x10E3/uL (ref 0.0–0.4)
Eos: 1 %
Ferritin: 332 ng/mL (ref 30–400)
Folate: 16.4 ng/mL (ref >3.0)
Hematocrit: 45.9 % (ref 37.5–51.0)
Hemoglobin: 15.3 g/dL (ref 13.0–17.7)
Immature Grans (Abs): 0.1 x10E3/uL (ref 0.0–0.1)
Immature Granulocytes: 1 %
Iron Saturation: 27 % (ref 15–55)
Iron: 88 ug/dL (ref 38–169)
Lymphocytes Absolute: 1.8 x10E3/uL (ref 0.7–3.1)
Lymphs: 22 %
MCH: 29.9 pg (ref 26.6–33.0)
MCHC: 33.3 g/dL (ref 31.5–35.7)
MCV: 90 fL (ref 79–97)
Monocytes Absolute: 0.6 x10E3/uL (ref 0.1–0.9)
Monocytes: 8 %
Neutrophils Absolute: 5.4 x10E3/uL (ref 1.4–7.0)
Neutrophils: 67 %
Platelets: 264 x10E3/uL (ref 150–450)
RBC: 5.11 x10E6/uL (ref 4.14–5.80)
RDW: 12.4 % (ref 11.6–15.4)
Retic Ct Pct: 1.5 % (ref 0.6–2.6)
Total Iron Binding Capacity: 324 ug/dL (ref 250–450)
UIBC: 236 ug/dL (ref 111–343)
Vitamin B-12: 563 pg/mL (ref 232–1245)
WBC: 7.9 x10E3/uL (ref 3.4–10.8)

## 2024-01-20 LAB — CMP14+EGFR
ALT: 13 IU/L (ref 0–44)
AST: 21 IU/L (ref 0–40)
Albumin: 4.3 g/dL (ref 3.8–4.8)
Alkaline Phosphatase: 77 IU/L (ref 47–123)
BUN/Creatinine Ratio: 27 — AB (ref 10–24)
BUN: 25 mg/dL (ref 8–27)
Bilirubin Total: 0.4 mg/dL (ref 0.0–1.2)
CO2: 26 mmol/L (ref 20–29)
Calcium: 9.4 mg/dL (ref 8.6–10.2)
Chloride: 94 mmol/L — AB (ref 96–106)
Creatinine, Ser: 0.92 mg/dL (ref 0.76–1.27)
Globulin, Total: 2.2 g/dL (ref 1.5–4.5)
Glucose: 118 mg/dL — AB (ref 70–99)
Potassium: 4.2 mmol/L (ref 3.5–5.2)
Sodium: 137 mmol/L (ref 134–144)
Total Protein: 6.5 g/dL (ref 6.0–8.5)
eGFR: 87 mL/min/1.73 (ref >59)

## 2024-01-20 LAB — MICROALBUMIN / CREATININE URINE RATIO
Creatinine, Urine: 111.3 mg/dL
Microalb/Creat Ratio: 44 mg/g{creat} — ABNORMAL HIGH (ref 0–29)
Microalbumin, Urine: 49 ug/mL

## 2024-01-20 MED ORDER — DAPAGLIFLOZIN PROPANEDIOL 10 MG PO TABS: 10.0000 mg | ORAL_TABLET | Freq: Every day | ORAL | 3 refills | Status: AC

## 2024-01-20 NOTE — Telephone Encounter (Unsigned)
 Copied from CRM [id number]. Topic: Clinical - Lab/Test Results >> Jan 20, 2024  4:05 PM Geneva B wrote: Reason for CRM: please call patient to go over to test results please call  [phone] (M) [phone] (H)

## 2024-01-20 NOTE — Telephone Encounter (Signed)
 Copied from CRM #[id number]. Topic: Clinical - Prescription Issue >> Jan 20, 2024  5:00 PM Mercer PEDLAR wrote: Reason for CRM: Patient calling regarding cost of dapagliflozin  propanediol (FARXIGA ) 10 MG TABS tablet. He stated it is over $300 and he would like to check if there are any alternatives.

## 2024-01-21 ENCOUNTER — Telehealth: Payer: Self-pay

## 2024-01-21 NOTE — Telephone Encounter (Signed)
 Pt was made aware of results, please see result notes.

## 2024-01-21 NOTE — Telephone Encounter (Signed)
 Copied from CRM #[id number]. Topic: Clinical - Prescription Issue >> Jan 21, 2024 11:04 AM Wess RAMAN wrote: Reason for CRM: Patient stated dapagliflozin  propanediol (FARXIGA ) 10 MG TABS tablet is too high and it costing over $300 and would like a different medication called in.  Callback #: [id number]  [phone] [place] [place] - [address] [place] [address] Phone: [phone] Fax: [phone] Hours: Not open 24 hours

## 2024-01-24 ENCOUNTER — Telehealth: Payer: Self-pay | Admitting: *Deleted

## 2024-01-24 NOTE — Progress Notes (Signed)
 Care Guide Pharmacy Note  01/24/2024 Name: Ronald Pitts MRN: [id number] DOB: 1950/08/26  Referred By: Severa Rock HERO, FNP Reason for referral: Complex Care Management (Outreach to schedule referral with pharmacist )   Ronald Pitts is a 73 y.o. year old male who is a primary care patient of Rakes, Rock HERO, FNP.  Ronald Pitts was referred to the pharmacist for assistance related to: DMII  Successful contact was made with the patient to discuss pharmacy services including being ready for the pharmacist to call at least 5 minutes before the scheduled appointment time and to have medication bottles and any blood pressure readings ready for review. The patient agreed to meet with the pharmacist via telephone visit on 01/26/2024  Ronald Pitts, CMA [place]  Ronald Pitts - [facility], [facility] Guide Direct Dial: [phone]  Fax: [phone] Website: [email or web address]

## 2024-01-25 ENCOUNTER — Telehealth: Payer: Self-pay | Admitting: Pharmacist

## 2024-01-25 DIAGNOSIS — R350 Frequency of micturition: Secondary | ICD-10-CM | POA: Diagnosis not present

## 2024-01-25 NOTE — Progress Notes (Unsigned)
   01/25/2024 Name: Bryen Hinderman MRN: [id number] DOB: [birth year]  No chief complaint on file.   Delrick Dehart is a 73 y.o. year old male who presented for a telephone visit.   They were referred to the pharmacist by their PCP for assistance in managing diabetes and medication access.    Subjective:  Care Team: Primary Care Provider: Severa Rock HERO, FNP {careteamprovider:[id number]}  Medication Access/Adherence  Current Pharmacy:  [phone] [place] [place] - [address] [place] [address] Phone: [phone] Fax: [phone]   Patient reports affordability concerns with their medications: Yes  Patient reports access/transportation concerns to their pharmacy: No  Patient reports adherence concerns with their medications:  No     Diabetes:  Current medications: Farxiga  Medications tried in the past: metformin  (discontinued due to excessive sweating, diarrhea, and abdominal rash), Ozempic   Current glucose readings: ***   Patient denies hypoglycemic s/sx including dizziness, shakiness, sweating. Patient denies hyperglycemic symptoms including polyuria, polydipsia, polyphagia, nocturia, neuropathy, blurred vision.  Current meal patterns:  Discussed meal planning options and Plate method for healthy eating Avoid sugary drinks and desserts Incorporate balanced protein, non starchy veggies, 1 serving of carbohydrate with each meal Increase water intake Increase physical activity as able  Current physical activity: ***  Current medication access support: will enroll in AZ&me PAP  Macrovascular and Microvascular Risk Reduction:  Statin? No but LDL 47; ACEi/ARB? no; no history of therapy but is indicated Last urinary albumin/creatinine ratio:  Lab Results  Component Value Date   MICRALBCREAT 44 (H) 01/19/2024   MICRALBCREAT 27 02/10/2022   Last eye exam:   Last foot exam: 01/19/2024 Tobacco Use:  Tobacco Use: Medium Risk  (01/19/2024)   Patient History    Smoking Tobacco Use: Former    Smokeless Tobacco Use: Never    Passive Exposure: Not on file    Objective:  Lab Results  Component Value Date   HGBA1C 6.4 (H) 01/19/2024    Lab Results  Component Value Date   CREATININE 0.92 01/19/2024   BUN 25 01/19/2024   NA 137 01/19/2024   K 4.2 01/19/2024   CL 94 (L) 01/19/2024   CO2 26 01/19/2024    Lab Results  Component Value Date   CHOL 106 09/28/2023   HDL 33 (L) 09/28/2023   LDLCALC 49 09/28/2023   TRIG 139 09/28/2023   CHOLHDL 3.2 09/28/2023    Medications Reviewed Today   Medications were not reviewed in this encounter       Assessment/Plan:   Diabetes: - Currently controlled; goal A1c <7%. Cardiorenal risk reduction is opportunities for improvement.. Blood pressure {ACTION; IS/IS NOT:21021397} at goal <130/80. LDL is at goal.  - {DMinterventions:33484} - {start stop continue:33488} - {DM Med Counseling:33486:x}  Follow Up Plan: ***  Mliss Tarry Griffin, PharmD, BCACP, CPP Clinical Pharmacist, [facility]

## 2024-01-25 NOTE — Telephone Encounter (Signed)
 Patient to enroll in new Farxiga  AZ&me PAP.   Will continue Farxiga  10mg  daily when his shipment arrives to his home.  Please send me entire PAP.  Will escribe when application received.  1st fill was on 01/20/24 for 90 DS.    Thank you! Massa Pe

## 2024-01-26 ENCOUNTER — Other Ambulatory Visit (INDEPENDENT_AMBULATORY_CARE_PROVIDER_SITE_OTHER)

## 2024-01-26 DIAGNOSIS — Z7984 Long term (current) use of oral hypoglycemic drugs: Secondary | ICD-10-CM

## 2024-01-26 DIAGNOSIS — E119 Type 2 diabetes mellitus without complications: Secondary | ICD-10-CM

## 2024-01-28 ENCOUNTER — Telehealth: Payer: Self-pay

## 2024-01-28 NOTE — Progress Notes (Signed)
 Pharmacy Medication Assistance Program Note    01/28/2024  Patient ID: Paulo Keimig, male   DOB: Jul 09, 1950, 73 y.o.   MRN: [id number]     01/28/2024  Outreach [employer]ication [employer]  Manufacturer Medication One [employer]  [employer] [facility] Farxiga   Dose of Farxiga  10MG   Type of Sport and exercise psychologist  Date Application Sent to Prescriber 01/28/2024  Name of Prescriber LINDA RAKES     NEW  Emailed to [employer]

## 2024-02-02 ENCOUNTER — Ambulatory Visit: Admitting: Family Medicine

## 2024-03-18 ENCOUNTER — Other Ambulatory Visit: Payer: Self-pay | Admitting: Family Medicine

## 2024-03-18 DIAGNOSIS — I10 Essential (primary) hypertension: Secondary | ICD-10-CM

## 2024-04-11 NOTE — Telephone Encounter (Signed)
 Patient has appt 04/25/24, still needing PAP?  Just doing some f/u's on my open encounters!

## 2024-04-18 NOTE — Telephone Encounter (Signed)
 PAP: Application for Marcelline Deist has been submitted to [employer] (AZ&Me), via fax

## 2024-04-21 ENCOUNTER — Ambulatory Visit: Admitting: Family Medicine

## 2024-04-21 NOTE — Telephone Encounter (Signed)
[facility] APPROVED

## 2024-04-25 ENCOUNTER — Ambulatory Visit: Payer: Self-pay | Admitting: Family Medicine

## 2024-05-16 ENCOUNTER — Ambulatory Visit: Payer: Self-pay | Admitting: Family Medicine

## 2024-05-16 ENCOUNTER — Encounter: Payer: Self-pay | Admitting: Family Medicine

## 2024-05-16 ENCOUNTER — Ambulatory Visit: Admitting: Family Medicine

## 2024-05-16 VITALS — BP 130/73 | HR 69 | Temp 97.3°F | Ht 69.0 in | Wt 280.2 lb

## 2024-05-16 DIAGNOSIS — I152 Hypertension secondary to endocrine disorders: Secondary | ICD-10-CM

## 2024-05-16 DIAGNOSIS — Z7984 Long term (current) use of oral hypoglycemic drugs: Secondary | ICD-10-CM

## 2024-05-16 DIAGNOSIS — E1142 Type 2 diabetes mellitus with diabetic polyneuropathy: Secondary | ICD-10-CM | POA: Insufficient documentation

## 2024-05-16 DIAGNOSIS — E1159 Type 2 diabetes mellitus with other circulatory complications: Secondary | ICD-10-CM

## 2024-05-16 DIAGNOSIS — Z6841 Body Mass Index (BMI) 40.0 and over, adult: Secondary | ICD-10-CM | POA: Diagnosis not present

## 2024-05-16 DIAGNOSIS — K5901 Slow transit constipation: Secondary | ICD-10-CM | POA: Diagnosis not present

## 2024-05-16 DIAGNOSIS — E119 Type 2 diabetes mellitus without complications: Secondary | ICD-10-CM | POA: Insufficient documentation

## 2024-05-16 DIAGNOSIS — E1169 Type 2 diabetes mellitus with other specified complication: Secondary | ICD-10-CM | POA: Diagnosis not present

## 2024-05-16 DIAGNOSIS — C61 Malignant neoplasm of prostate: Secondary | ICD-10-CM

## 2024-05-16 LAB — BAYER DCA HB A1C WAIVED: HB A1C (BAYER DCA - WAIVED): 6.4 % — ABNORMAL HIGH (ref 4.8–5.6)

## 2024-05-16 MED ORDER — POLYETHYLENE GLYCOL 3350 17 GM/SCOOP PO POWD: 17.0000 g | Freq: Every day | ORAL | 1 refills | Status: AC

## 2024-05-16 MED ORDER — GABAPENTIN 100 MG PO CAPS: 100.0000 mg | ORAL_CAPSULE | Freq: Two times a day (BID) | ORAL | 0 refills | Status: DC

## 2024-05-16 MED ORDER — HYDROCHLOROTHIAZIDE 50 MG PO TABS: 50.0000 mg | ORAL_TABLET | Freq: Every day | ORAL | 1 refills | Status: AC

## 2024-05-16 NOTE — Patient Instructions (Addendum)

## 2024-05-16 NOTE — Progress Notes (Signed)
 "    Subjective:  Patient ID: Ronald Pitts, male    DOB: [birth year], 74 y.o.   MRN: [id number]  Patient Care Team: Severa Rock HERO, FNP as PCP - General (Family Medicine) Vicci Mcardle, [place] (Optometry) Selma Donnice SAUNDERS, MD as Consulting Physician (Urology) Patrcia Donnice, MD as Consulting Physician (Radiation Oncology)   Chief Complaint:  Diabetes (3 month follow up )   HPI: Ronald Pitts is a 74 y.o. male presenting on 05/16/2024 for Diabetes (3 month follow up )   Ronald Pitts is a 74 year old male who presents today for management of diabetes with associated hyperlipidemia and hypertension. He complains with persistent numbness and tingling in his legs and feet.  He experiences persistent numbness and tingling in his legs and feet, which have not improved with the use of Nervive. He has not previously been on any medication specifically for neuropathy, such as gabapentin . The numbness and tingling are constant, and he notes balance issues when first standing up. Occasionally, similar symptoms occur in his hands, but he denies any issues with falling or loss of function such as difficulty picking up objects.  He has not been monitoring his blood sugar levels and denies symptoms of increased hunger, thirst, or urination. No recent headaches, chest pain, leg swelling, shortness of breath, visual changes, or confusion.  He mentions a history of a rash on his belly, which has since resolved. He is currently taking Farxiga , which causes significant constipation. To manage this, he takes Colace once a day and uses Benefiber, though he feels these have not been very effective. He has tried Miralax  in the past but not recently. He reports having bowel movements at least every other day.  He denies any significant rashes in the genital area, though he notes a minor rash that comes and goes. He has no changes in urination, no blood in his urine, no night sweats, and no weight loss. He is seen every  six months for follow-up post-prostate treatment.          Relevant past medical, surgical, family, and social history reviewed and updated as indicated.  Allergies and medications reviewed and updated. Data reviewed: Chart in Epic.   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   urologist--- dr pace/  radiation oncologsit--- dr patrcia---  dx 12/ 2023,  Gleason 4+3   OA (osteoarthritis)    Pre-diabetes    Wears partial dentures    upper and lower    Past Surgical History:  Procedure Laterality Date   COLONOSCOPY W/ POLYPECTOMY  05/08/2015   dr avram   FINGER SURGERY Right 1970   index  ,  repair amputation injury   GOLD SEED IMPLANT N/A 06/19/2022   Procedure: GOLD SEED IMPLANT;  Surgeon: Selma Donnice SAUNDERS, MD;  Location: [facility];  Service: Urology;  Laterality: N/A;   SPACE OAR INSTILLATION N/A 06/19/2022   Procedure: SPACE OAR INSTILLATION;  Surgeon: Selma Donnice SAUNDERS, MD;  Location: [facility];  Service: Urology;  Laterality: N/A;    Social History   Socioeconomic History   Marital status: Married    Spouse name: [name]   Number of children: 2   Years of education: Not on file   Highest education level: 12th grade  Occupational History   Occupation: retired  Tobacco Use   Smoking  status: Former    Current packs/day: 0.00    Types: Cigarettes    Start date: 36    Quit date: 1999    Years since quitting: 27.0   Smokeless tobacco: Never  Vaping Use   Vaping status: Never Used  Substance and Sexual Activity   Alcohol use: Not Currently    Comment: occasional   Drug use: Never   Sexual activity: Not on file  Other Topics Concern   Not on file  Social History Narrative   One level living with wife, [name] - son lives in [place], daughter in [place]   Social Drivers of Health    Tobacco Use: Medium Risk (05/16/2024)   Patient History    Smoking Tobacco Use: Former    Smokeless Tobacco Use: Never    Passive Exposure: Not on Actuary Strain: Low Risk (05/15/2024)   Overall Financial Resource Strain (CARDIA)    Difficulty of Paying Living Expenses: Not hard at all  Food Insecurity: No Food Insecurity (05/15/2024)   Epic    Worried About Programme Researcher, Broadcasting/film/video in the Last Year: Never true    Ran Out of Food in the Last Year: Never true  Transportation Needs: No Transportation Needs (05/15/2024)   Epic    Lack of Transportation (Medical): No    Lack of Transportation (Non-Medical): No  Physical Activity: Inactive (05/15/2024)   Exercise Vital Sign    Days of Exercise per Week: 0 days    Minutes of Exercise per Session: Not on file  Stress: No Stress Concern Present (05/15/2024)   [employer] of Occupational Health - Occupational Stress Questionnaire    Feeling of Stress: Not at all  Social Connections: Moderately Integrated (05/15/2024)   Social Connection and Isolation Panel    Frequency of Communication with Friends and Family: Three times a week    Frequency of Social Gatherings with Friends and Family: Once a week    Attends Religious Services: 1 to 4 times per year    Active Member of Clubs or Organizations: No    Attends Banker Meetings: Not on file    Marital Status: Married  Catering Manager Violence: Not At Risk (11/15/2023)   Epic    Fear of Current or Ex-Partner: No    Emotionally Abused: No    Physically Abused: No    Sexually Abused: No  Depression (PHQ2-9): Low Risk (05/16/2024)   Depression (PHQ2-9)    PHQ-2 Score: 0  Alcohol Screen: Low Risk (05/15/2024)   Alcohol Screen    Last Alcohol Screening Score (AUDIT): 2  Housing: Low Risk (05/15/2024)   Epic    Unable to Pay for Housing in the Last Year: No    Number of Times Moved in the Last Year: 0    Homeless in the Last Year: No  Utilities: Not At Risk  (11/15/2023)   Epic    Threatened with loss of utilities: No  Health Literacy: Adequate Health Literacy (11/15/2023)   B1300 Health Literacy    Frequency of need for help with medical instructions: Never    Outpatient Encounter Medications as of 05/16/2024  Medication Sig   aspirin 81 MG tablet Take 81 mg by mouth daily.   dapagliflozin  propanediol (FARXIGA ) 10 MG TABS tablet Take 1 tablet (10 mg total) by mouth daily.   fluticasone  (FLONASE ) 50 MCG/ACT nasal spray SPRAY 2 SPRAYS INTO EACH NOSTRIL EVERY DAY   gabapentin  (NEURONTIN ) 100 MG capsule Take 1 capsule (100 mg total)  by mouth 2 (two) times daily.   polyethylene glycol powder (GLYCOLAX /MIRALAX ) 17 GM/SCOOP powder Take 17 g by mouth daily. Dissolve 1 capful (17g) in 4-8 ounces of liquid and take by mouth daily.   [DISCONTINUED] hydrochlorothiazide  (HYDRODIURIL ) 50 MG tablet TAKE 1 TABLET BY MOUTH EVERY DAY   [DISCONTINUED] triamcinolone  cream (KENALOG ) 0.1 % Apply topically 2 (two) times daily.   hydrochlorothiazide  (HYDRODIURIL ) 50 MG tablet Take 1 tablet (50 mg total) by mouth daily.   Facility-Administered Encounter Medications as of 05/16/2024  Medication   sodium phosphate (FLEET) 7-19 GM/118ML enema 1 enema    Allergies[1]  Pertinent ROS per HPI, otherwise unremarkable      Objective:  BP 130/73   Pulse 69   Temp (!) 97.3 F (36.3 C)   Ht 5' 9 (1.753 m)   Wt 280 lb 3.2 oz (127.1 kg)   SpO2 93%   BMI 41.38 kg/m    Wt Readings from Last 3 Encounters:  05/16/24 280 lb 3.2 oz (127.1 kg)  01/19/24 285 lb 6.4 oz (129.5 kg)  12/08/23 289 lb 9.6 oz (131.4 kg)    Physical Exam Vitals and nursing note reviewed.  Constitutional:      Appearance: Normal appearance. He is morbidly obese.  HENT:     Head: Normocephalic and atraumatic.     Nose: Nose normal.     Mouth/Throat:     Mouth: Mucous membranes are moist.  Eyes:     Pupils: Pupils are equal, round, and reactive to light.  Cardiovascular:     Rate and  Rhythm: Normal rate and regular rhythm.     Heart sounds: Normal heart sounds.  Pulmonary:     Effort: Pulmonary effort is normal.     Breath sounds: Normal breath sounds.  Musculoskeletal:     Cervical back: Neck supple.     Right lower leg: Edema present.     Left lower leg: Edema present.  Skin:    General: Skin is warm and dry.     Capillary Refill: Capillary refill takes less than 2 seconds.  Neurological:     General: No focal deficit present.     Mental Status: He is alert and oriented to person, place, and time.  Psychiatric:        Mood and Affect: Mood normal.        Behavior: Behavior normal. Behavior is cooperative.        Thought Content: Thought content normal.        Judgment: Judgment normal.      Results for orders placed or performed in visit on 01/19/24  Bayer DCA Hb A1c Waived   Collection Time: 01/19/24 10:43 AM  Result Value Ref Range   HB A1C (BAYER DCA - WAIVED) 6.4 (H) 4.8 - 5.6 %  Anemia Profile B   Collection Time: 01/19/24 11:12 AM  Result Value Ref Range   Total Iron Binding Capacity 324 250 - 450 ug/dL   UIBC 763 888 - 656 ug/dL   Iron 88 38 - 830 ug/dL   Iron Saturation 27 15 - 55 %   Ferritin 332 30 - 400 ng/mL   Vitamin B-12 563 232 - 1,245 pg/mL   Folate 16.4 >3.0 ng/mL   WBC 7.9 3.4 - 10.8 x10E3/uL   RBC 5.11 4.14 - 5.80 x10E6/uL   Hemoglobin 15.3 13.0 - 17.7 g/dL   Hematocrit 54.0 62.4 - 51.0 %   MCV 90 79 - 97 fL   MCH 29.9 26.6 -  33.0 pg   MCHC 33.3 31.5 - 35.7 g/dL   RDW 87.5 88.3 - 84.5 %   Platelets 264 150 - 450 x10E3/uL   Neutrophils 67 Not Estab. %   Lymphs 22 Not Estab. %   Monocytes 8 Not Estab. %   Eos 1 Not Estab. %   Basos 1 Not Estab. %   Neutrophils Absolute 5.4 1.4 - 7.0 x10E3/uL   Lymphocytes Absolute 1.8 0.7 - 3.1 x10E3/uL   Monocytes Absolute 0.6 0.1 - 0.9 x10E3/uL   EOS (ABSOLUTE) 0.1 0.0 - 0.4 x10E3/uL   Basophils Absolute 0.1 0.0 - 0.2 x10E3/uL   Immature Granulocytes 1 Not Estab. %   Immature Grans  (Abs) 0.1 0.0 - 0.1 x10E3/uL   Retic Ct Pct 1.5 0.6 - 2.6 %  CMP14+EGFR   Collection Time: 01/19/24 11:12 AM  Result Value Ref Range   Glucose 118 (H) 70 - 99 mg/dL   BUN 25 8 - 27 mg/dL   Creatinine, Ser 9.07 0.76 - 1.27 mg/dL   eGFR 87 >40 fO/fpw/8.26   BUN/Creatinine Ratio 27 (H) 10 - 24   Sodium 137 134 - 144 mmol/L   Potassium 4.2 3.5 - 5.2 mmol/L   Chloride 94 (L) 96 - 106 mmol/L   CO2 26 20 - 29 mmol/L   Calcium 9.4 8.6 - 10.2 mg/dL   Total Protein 6.5 6.0 - 8.5 g/dL   Albumin 4.3 3.8 - 4.8 g/dL   Globulin, Total 2.2 1.5 - 4.5 g/dL   Bilirubin Total 0.4 0.0 - 1.2 mg/dL   Alkaline Phosphatase 77 47 - 123 IU/L   AST 21 0 - 40 IU/L   ALT 13 0 - 44 IU/L  Microalbumin / creatinine urine ratio   Collection Time: 01/19/24 11:16 AM  Result Value Ref Range   Creatinine, Urine 111.3 Not Estab. mg/dL   Microalbumin, Urine 50.9 Not Estab. ug/mL   Microalb/Creat Ratio 44 (H) 0 - 29 mg/g creat       Pertinent labs & imaging results that were available during my care of the patient were reviewed by me and considered in my medical decision making.  Assessment & Plan:  Ronald Pitts was seen today for diabetes.  Diagnoses and all orders for this visit:  Type 2 diabetes mellitus with other specified complication, without long-term current use of insulin (HCC) -     Bayer DCA Hb A1c Waived -     CMP14+EGFR -     hydrochlorothiazide  (HYDRODIURIL ) 50 MG tablet; Take 1 tablet (50 mg total) by mouth daily.  Polyneuropathy due to type 2 diabetes mellitus (HCC) -     Bayer DCA Hb A1c Waived -     CMP14+EGFR -     gabapentin  (NEURONTIN ) 100 MG capsule; Take 1 capsule (100 mg total) by mouth 2 (two) times daily.  Hypertension associated with diabetes (HCC) -     Bayer DCA Hb A1c Waived -     CMP14+EGFR -     hydrochlorothiazide  (HYDRODIURIL ) 50 MG tablet; Take 1 tablet (50 mg total) by mouth daily.  Morbid obesity with BMI of 40.0-44.9, adult (HCC) -     Bayer DCA Hb A1c Waived -      CMP14+EGFR  Diabetes mellitus treated with oral medication (HCC) -     Bayer DCA Hb A1c Waived -     CMP14+EGFR  Slow transit constipation -     polyethylene glycol powder (GLYCOLAX /MIRALAX ) 17 GM/SCOOP powder; Take 17 g by mouth daily. Dissolve 1 capful (  17g) in 4-8 ounces of liquid and take by mouth daily.  Malignant neoplasm of prostate (HCC) Followed by urology.         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 No prior treatment with gabapentin . No significant balance issues or functional impairment. Blood sugar levels not regularly monitored, but no symptoms of hyperglycemia reported. A1c is 6.4, indicating good glycemic control. - Initiated gabapentin  100 mg at night, with option to increase to twice daily if symptoms persist. - Educated on potential side effect of gabapentin  causing drowsiness. - Continue Farxiga  for kidney protection, monitor for genital rashes.  Slow transit constipation Chronic constipation likely exacerbated by Farxiga . Current regimen includes Colace and Benefiber with limited efficacy. Bowel movements occur every other day. - Initiated Miralax , one capful daily in the morning with plenty of water. - Instructed to discontinue Colace once bowel movements become regular. - Will consider alternative treatments like Linzess if Miralax  is ineffective after a few weeks.  Malignant neoplasm of prostate Prostate cancer under surveillance with regular follow-ups. No changes in urination, hematuria, night sweats, or weight loss. PSA levels monitored every six months, with potential to extend to annual monitoring if stable. - Continue regular follow-ups every six months for PSA monitoring. - Monitor for signs of recurrence such as changes in urinary stream or hematuria.          Continue all other maintenance medications.  Follow up plan: Return in about 3 months (around  08/14/2024), or if symptoms worsen or fail to improve, for DM.   Continue healthy lifestyle choices, including diet (rich in fruits, vegetables, and lean proteins, and low in salt and simple carbohydrates) and exercise (at least 30 minutes of moderate physical activity daily).  Educational handout given for DM, gabapentin   The above assessment and management plan was discussed with the patient. The patient verbalized understanding of and has agreed to the management plan. Patient is aware to call the clinic if they develop any new symptoms or if symptoms persist or worsen. Patient is aware when to return to the clinic for a follow-up visit. Patient educated on when it is appropriate to go to the emergency department.   Rosaline Bruns, FNP-C [facility] [phone]     [1]  Allergies Allergen Reactions   Metformin  And Related Other (See Comments)    excessive sweating, diarrhea, and abdominal rash.   Ozempic  (0.25 Or 0.5 Mg-Dose) [Semaglutide (0.25 Or 0.5mg -Dos)] Other (See Comments)    GI intolerance   "

## 2024-05-17 LAB — CMP14+EGFR
ALT: 12 IU/L (ref 0–44)
AST: 15 IU/L (ref 0–40)
Albumin: 4.4 g/dL (ref 3.8–4.8)
Alkaline Phosphatase: 88 IU/L (ref 47–123)
BUN/Creatinine Ratio: 21 (ref 10–24)
BUN: 22 mg/dL (ref 8–27)
Bilirubin Total: 0.7 mg/dL (ref 0.0–1.2)
CO2: 28 mmol/L (ref 20–29)
Calcium: 9.1 mg/dL (ref 8.6–10.2)
Chloride: 95 mmol/L — ABNORMAL LOW (ref 96–106)
Creatinine, Ser: 1.04 mg/dL (ref 0.76–1.27)
Globulin, Total: 2 g/dL (ref 1.5–4.5)
Glucose: 127 mg/dL — ABNORMAL HIGH (ref 70–99)
Potassium: 4.6 mmol/L (ref 3.5–5.2)
Sodium: 139 mmol/L (ref 134–144)
Total Protein: 6.4 g/dL (ref 6.0–8.5)
eGFR: 75 mL/min/1.73

## 2024-05-23 ENCOUNTER — Encounter: Payer: Self-pay | Admitting: Family Medicine

## 2024-05-23 ENCOUNTER — Other Ambulatory Visit: Payer: Self-pay | Admitting: Family Medicine

## 2024-05-23 DIAGNOSIS — E1142 Type 2 diabetes mellitus with diabetic polyneuropathy: Secondary | ICD-10-CM

## 2024-05-23 MED ORDER — DULOXETINE HCL 20 MG PO CPEP: 20.0000 mg | ORAL_CAPSULE | Freq: Every day | ORAL | 3 refills | Status: AC

## 2024-09-13 ENCOUNTER — Ambulatory Visit: Admitting: Family Medicine

## 2024-11-15 ENCOUNTER — Ambulatory Visit: Payer: Self-pay
